# Patient Record
Sex: Female | Born: 1980 | Race: Black or African American | Hispanic: No | Marital: Married | Smoking: Never smoker
Health system: Southern US, Community
[De-identification: ages and names within clinical notes are randomized; demographics above are authoritative.]

## PROBLEM LIST (undated history)

## (undated) DIAGNOSIS — R51 Headache: Secondary | ICD-10-CM

## (undated) DIAGNOSIS — J45909 Unspecified asthma, uncomplicated: Secondary | ICD-10-CM

## (undated) DIAGNOSIS — T8484XA Pain due to internal orthopedic prosthetic devices, implants and grafts, initial encounter: Secondary | ICD-10-CM

## (undated) DIAGNOSIS — R519 Headache, unspecified: Secondary | ICD-10-CM

## (undated) HISTORY — PX: ORIF FOOT FRACTURE: SHX2123

## (undated) HISTORY — PX: FOOT SURGERY: SHX648

## (undated) HISTORY — PX: BREAST CYST EXCISION: SHX579

---

## 2013-08-24 ENCOUNTER — Ambulatory Visit (INDEPENDENT_AMBULATORY_CARE_PROVIDER_SITE_OTHER): Payer: Managed Care, Other (non HMO) | Admitting: Family Medicine

## 2013-08-24 ENCOUNTER — Ambulatory Visit: Payer: Managed Care, Other (non HMO)

## 2013-08-24 VITALS — BP 102/68 | HR 98 | Temp 98.3°F | Resp 16 | Ht 67.0 in | Wt 185.0 lb

## 2013-08-24 DIAGNOSIS — M79609 Pain in unspecified limb: Secondary | ICD-10-CM

## 2013-08-24 DIAGNOSIS — M79622 Pain in left upper arm: Secondary | ICD-10-CM

## 2013-08-24 DIAGNOSIS — M79671 Pain in right foot: Secondary | ICD-10-CM

## 2013-08-24 MED ORDER — PREDNISONE 20 MG PO TABS: 40.0000 mg | ORAL_TABLET | Freq: Every day | ORAL | Status: DC

## 2013-08-24 NOTE — Progress Notes (Addendum)
Subjective:    Patient ID: Kaitlyn Valentine, female    DOB: 1980/09/30, 33 y.o.   MRN: [id number]  HPI Scribed for Robyn Haber MD, the patient was seen in room 4. This chart was scribed by Denice Bors, ED scribe. Patient's care was started at 11:11 AM  HPI Comments: Hx was provided by the pt.  Kaitlyn Valentine is a 33 y.o. female who presents to the [facility] complaining of constant unchanged right foot pain onset chronic. Describes pain as "burning like pepper." Reports decreased ROM of all toes. Reports pain is exacerbated with movement and by touch. Denies any alleviating factors. Denies associated fever, numbness, and recent trauma/fall. Reports PMHx of right foot surgery last year in [place].    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   History reviewed. No pertinent past medical history.  Past Surgical History  Procedure Laterality Date   Fracture surgery     Cyst excision      Breast    No family history on file.  History   Social History   Marital Status: Single    Spouse Name: N/A    Number of Children: N/A   Years of Education: N/A   Occupational History   Not on file.   Social History Main Topics   Smoking status: Never Smoker    Smokeless tobacco: Not on file   Alcohol Use: Not on file   Drug Use: Not on file   Sexual Activity: Not on file   Other Topics Concern   Not on file   Social History Narrative   No narrative on file    No Known Allergies  There are no active problems to display for this patient.   Filed Vitals:   08/24/13 1106  BP: 102/68  Pulse: 98  Temp: 98.3 F (36.8 C)  Resp: 16  Height: 5\' 7"  (1.702 m)  Weight: 185 lb (83.915 kg)  SpO2: 100%       Review of Systems  Constitutional: Negative for fever.  Musculoskeletal:       Right foot pain   Psychiatric/Behavioral: Negative for confusion.       Objective:   Physical Exam  Nursing note and vitals  reviewed. Constitutional: She is oriented to person, place, and time. She appears well-developed and well-nourished. No distress.  HENT:  Head: Normocephalic and atraumatic.  Eyes: EOM are normal.  Neck: Neck supple. No tracheal deviation present.  Cardiovascular: Normal rate, intact distal pulses and normal pulses.   Pulses:      Dorsalis pedis pulses are 2+ on the right side.       Posterior tibial pulses are 2+ on the right side.  Pulmonary/Chest: Effort normal. No respiratory distress.  Musculoskeletal:       Right foot: She exhibits decreased range of motion.  Two parallel surgical scars on the dorsum of right    Neurological: She is alert and oriented to person, place, and time.  Skin: Skin is warm and dry.  Psychiatric: She has a normal mood and affect. Her behavior is normal.   [facility] reading (PRIMARY) by  Dr. Joseph Art:  Right foot. Screw and 2 plates are in position. There may be some backing out of the screw  Patient also complains about left axillary pain and she's had surgery for a lipoma there.     Assessment & Plan:     Foot pain, right - Plan: DG Foot Complete Right, Ambulatory referral to  Orthopedic Surgery  Left axillary pain - Plan: Ambulatory referral to Surgical Oncology  Signed, Robyn Haber, MD

## 2013-09-04 ENCOUNTER — Other Ambulatory Visit (INDEPENDENT_AMBULATORY_CARE_PROVIDER_SITE_OTHER): Payer: Self-pay | Admitting: General Surgery

## 2013-09-04 DIAGNOSIS — M79622 Pain in left upper arm: Secondary | ICD-10-CM

## 2013-09-05 ENCOUNTER — Telehealth (INDEPENDENT_AMBULATORY_CARE_PROVIDER_SITE_OTHER): Payer: Self-pay | Admitting: *Deleted

## 2013-09-05 NOTE — Telephone Encounter (Signed)
LMOM for pt to call back. Please advise pt of her apt for her [facility] [employer] is  at [place] [phone] arrive at 9:15 a.m. with appt time 9:30 a.m Address is [address] at [place] on the 4th floor.

## 2013-09-05 NOTE — Telephone Encounter (Signed)
Patient return call to our office, appt given for [place] on 09/14/13 @ 9:30, with arrival time of 9:15 at the [facility].  Reminder given to patient for appointment on 09/10/13 with Dr. Barry Dienes

## 2013-09-10 ENCOUNTER — Ambulatory Visit (INDEPENDENT_AMBULATORY_CARE_PROVIDER_SITE_OTHER): Payer: Managed Care, Other (non HMO) | Admitting: General Surgery

## 2013-09-10 ENCOUNTER — Telehealth (INDEPENDENT_AMBULATORY_CARE_PROVIDER_SITE_OTHER): Payer: Self-pay | Admitting: General Surgery

## 2013-09-10 ENCOUNTER — Encounter (INDEPENDENT_AMBULATORY_CARE_PROVIDER_SITE_OTHER): Payer: Self-pay | Admitting: General Surgery

## 2013-09-10 VITALS — BP 110/70 | HR 72 | Temp 97.2°F | Resp 16 | Ht 67.0 in | Wt 189.0 lb

## 2013-09-10 DIAGNOSIS — R229 Localized swelling, mass and lump, unspecified: Secondary | ICD-10-CM

## 2013-09-10 DIAGNOSIS — R2230 Localized swelling, mass and lump, unspecified upper limb: Secondary | ICD-10-CM

## 2013-09-10 DIAGNOSIS — M79609 Pain in unspecified limb: Secondary | ICD-10-CM

## 2013-09-10 DIAGNOSIS — M79622 Pain in left upper arm: Secondary | ICD-10-CM

## 2013-09-10 NOTE — Patient Instructions (Signed)
Main risks are bleeding, infection, pain, fluid/blood retention in cavity.  You will need to refrain from heavy/strenuous activity for 1-2 weeks after surgery.  I will see you back at 3-4 weeks post op.

## 2013-09-10 NOTE — Telephone Encounter (Signed)
Patient met with surgery scheduling went over financial responsibilities, patient will call back to schedule

## 2013-09-12 DIAGNOSIS — R2230 Localized swelling, mass and lump, unspecified upper limb: Secondary | ICD-10-CM | POA: Insufficient documentation

## 2013-09-12 NOTE — Assessment & Plan Note (Signed)
The patient appears to have a recurrent left axillary mass. 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 Given the fact that it's extremely tender and palpable, we will remove it.  I made need to remove some skin as well in order to make it lie flat.  I discussed that she could not do any heavy lifting or strenuous activity for 2-3 weeks.    I reviewed risks of bleeding, infection, damage to adjacent structures, chronic pain, swelling, etc.  The patient agrees to proceed.

## 2013-09-12 NOTE — Progress Notes (Signed)
Patient ID: Kaitlyn Valentine, female   DOB: 25-Nov-1980, 34 y.o.   MRN: [id number]  Chief Complaint  Patient presents with  . New Evaluation    Eval left axillary cyst    HPI Kaitlyn Valentine is a 33 y.o. female.   HPI  Pt is 33 yo female with a recurrent area of swelling in her L axilla.  She had this removed elsewhere multiple years ago.  It started enlarging again over the last few months.  The area is quite tender, and sticks out.  She is also having trouble keeping the area clean.  She denies fever/chills.    No past medical history on file.  Past Surgical History  Procedure Laterality Date  . Fracture surgery    . Cyst excision      Breast    No family history on file.  Social History History  Substance Use Topics  . Smoking status: Never Smoker   . Smokeless tobacco: Not on file  . Alcohol Use: Not on file    No Known Allergies  No current outpatient prescriptions on file.   No current facility-administered medications for this visit.    Review of Systems Review of Systems  All other systems reviewed and are negative.   Blood pressure 110/70, pulse 72, temperature 97.2 F (36.2 C), temperature source Temporal, resp. rate 16, height 5\' 7"  (1.702 m), weight 189 lb (85.73 kg), last menstrual period 08/17/2013.  Physical Exam Physical Exam  Constitutional: She is oriented to person, place, and time. She appears well-developed and well-nourished. No distress.  HENT:  Head: Normocephalic and atraumatic.  Eyes: Conjunctivae are normal. Pupils are equal, round, and reactive to light. No scleral icterus.  Neck: Normal range of motion. No thyromegaly present.  Cardiovascular: Normal rate and regular rhythm.   Pulmonary/Chest: Effort normal. No respiratory distress.    Tender mass in left axilla.  Around 4 cm x 2 cm.  Some chronic skin changes with irritation.    Abdominal: Soft. She exhibits no distension.  Musculoskeletal: Normal range of motion. She exhibits no  edema and no tenderness.  Neurological: She is alert and oriented to person, place, and time. Coordination normal.  Skin: Skin is warm and dry. No rash noted. She is not diaphoretic. No erythema. No pallor.  Psychiatric: She has a normal mood and affect. Her behavior is normal. Judgment and thought content normal.    Assessment/Plan    Left axillary pain Patient does not appear to have a recurrent lipoma, hidradenitis, or lymph node.  I think she is just feeling scar tissue.  Axillary mass, left The patient appears to have a recurrent left axillary mass. 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 Given the fact that it's extremely tender and palpable, we will remove it.  I made need to remove some skin as well in order to make it lie flat.  I discussed that she could not do any heavy lifting or strenuous activity for 2-3 weeks.    I reviewed risks of bleeding, infection, damage to adjacent structures, chronic pain, swelling, etc.  The patient agrees to proceed.     I advised her that I would not perform surgery until we got mammo/ultrasound.     Stark Klein 09/12/2013, 10:40 PM

## 2013-09-12 NOTE — Assessment & Plan Note (Signed)
Patient does not appear to have a recurrent lipoma, hidradenitis, or lymph node.  I think she is just feeling scar tissue.

## 2013-09-14 ENCOUNTER — Ambulatory Visit
Admission: RE | Admit: 2013-09-14 | Discharge: 2013-09-14 | Disposition: A | Discharge status: Home / Self Care | Payer: Managed Care, Other (non HMO) | Source: Ambulatory Visit | Attending: General Surgery | Admitting: General Surgery

## 2013-09-14 ENCOUNTER — Other Ambulatory Visit (INDEPENDENT_AMBULATORY_CARE_PROVIDER_SITE_OTHER): Payer: Self-pay | Admitting: General Surgery

## 2013-09-14 DIAGNOSIS — M79622 Pain in left upper arm: Secondary | ICD-10-CM

## 2013-11-07 ENCOUNTER — Encounter (HOSPITAL_BASED_OUTPATIENT_CLINIC_OR_DEPARTMENT_OTHER): Payer: Self-pay | Admitting: *Deleted

## 2013-11-14 ENCOUNTER — Telehealth (INDEPENDENT_AMBULATORY_CARE_PROVIDER_SITE_OTHER): Payer: Self-pay | Admitting: General Surgery

## 2013-11-14 ENCOUNTER — Encounter (HOSPITAL_BASED_OUTPATIENT_CLINIC_OR_DEPARTMENT_OTHER)
Admission: RE | Disposition: A | Discharge status: Home / Self Care | Payer: Self-pay | Source: Ambulatory Visit | Attending: General Surgery

## 2013-11-14 ENCOUNTER — Encounter (HOSPITAL_BASED_OUTPATIENT_CLINIC_OR_DEPARTMENT_OTHER): Payer: Self-pay | Admitting: *Deleted

## 2013-11-14 ENCOUNTER — Encounter (HOSPITAL_BASED_OUTPATIENT_CLINIC_OR_DEPARTMENT_OTHER): Payer: Managed Care, Other (non HMO) | Admitting: Anesthesiology

## 2013-11-14 ENCOUNTER — Ambulatory Visit (HOSPITAL_BASED_OUTPATIENT_CLINIC_OR_DEPARTMENT_OTHER)
Admission: RE | Admit: 2013-11-14 | Discharge: 2013-11-14 | Disposition: A | Discharge status: Home / Self Care | Payer: Managed Care, Other (non HMO) | Source: Ambulatory Visit | Attending: General Surgery | Admitting: General Surgery

## 2013-11-14 ENCOUNTER — Ambulatory Visit (HOSPITAL_BASED_OUTPATIENT_CLINIC_OR_DEPARTMENT_OTHER): Payer: Managed Care, Other (non HMO) | Admitting: Anesthesiology

## 2013-11-14 DIAGNOSIS — D1739 Benign lipomatous neoplasm of skin and subcutaneous tissue of other sites: Secondary | ICD-10-CM | POA: Insufficient documentation

## 2013-11-14 HISTORY — PX: MASS EXCISION: SHX2000

## 2013-11-14 LAB — POCT HEMOGLOBIN-HEMACUE: HEMOGLOBIN: 13.2 g/dL (ref 12.0–15.0)

## 2013-11-14 SURGERY — EXCISION MASS
Anesthesia: General | Site: Axilla | Laterality: Left

## 2013-11-14 MED ORDER — MIDAZOLAM HCL 2 MG/2ML IJ SOLN
INTRAMUSCULAR | Status: AC
  Filled 2013-11-14: qty 2

## 2013-11-14 MED ORDER — CEFAZOLIN SODIUM-DEXTROSE 2-3 GM-% IV SOLR
2.0000 g | INTRAVENOUS | Status: AC
  Administered 2013-11-14: 2 g via INTRAVENOUS

## 2013-11-14 MED ORDER — BUPIVACAINE-EPINEPHRINE (PF) 0.25% -1:200000 IJ SOLN
INTRAMUSCULAR | Status: DC | PRN
  Administered 2013-11-14: 20 mL via PERINEURAL

## 2013-11-14 MED ORDER — OXYCODONE HCL 5 MG PO TABS: 5.0000 mg | ORAL_TABLET | ORAL | Status: DC | PRN

## 2013-11-14 MED ORDER — BUPIVACAINE HCL (PF) 0.25 % IJ SOLN
INTRAMUSCULAR | Status: AC
  Filled 2013-11-14: qty 30

## 2013-11-14 MED ORDER — ACETAMINOPHEN 325 MG PO TABS: 650.0000 mg | ORAL_TABLET | ORAL | Status: DC | PRN

## 2013-11-14 MED ORDER — OXYCODONE HCL 5 MG PO TABS: 5.0000 mg | ORAL_TABLET | Freq: Once | ORAL | Status: DC | PRN

## 2013-11-14 MED ORDER — FENTANYL CITRATE 0.05 MG/ML IJ SOLN: 50.0000 ug | INTRAMUSCULAR | Status: DC | PRN

## 2013-11-14 MED ORDER — SODIUM CHLORIDE 0.9 % IV SOLN: 250.0000 mL | INTRAVENOUS | Status: DC | PRN

## 2013-11-14 MED ORDER — MIDAZOLAM HCL 5 MG/5ML IJ SOLN
INTRAMUSCULAR | Status: DC | PRN
  Administered 2013-11-14: 2 mg via INTRAVENOUS

## 2013-11-14 MED ORDER — HYDROMORPHONE HCL PF 1 MG/ML IJ SOLN
0.2500 mg | INTRAMUSCULAR | Status: DC | PRN
  Administered 2013-11-14 (×3): 0.5 mg via INTRAVENOUS

## 2013-11-14 MED ORDER — ONDANSETRON HCL 4 MG/2ML IJ SOLN
INTRAMUSCULAR | Status: DC | PRN
  Administered 2013-11-14: 4 mg via INTRAVENOUS

## 2013-11-14 MED ORDER — HYDROMORPHONE HCL PF 1 MG/ML IJ SOLN
INTRAMUSCULAR | Status: AC
  Filled 2013-11-14: qty 1

## 2013-11-14 MED ORDER — FENTANYL CITRATE 0.05 MG/ML IJ SOLN
INTRAMUSCULAR | Status: AC
  Filled 2013-11-14: qty 4

## 2013-11-14 MED ORDER — CEFAZOLIN SODIUM-DEXTROSE 2-3 GM-% IV SOLR
INTRAVENOUS | Status: AC
  Filled 2013-11-14: qty 50

## 2013-11-14 MED ORDER — LACTATED RINGERS IV SOLN
INTRAVENOUS | Status: DC
  Administered 2013-11-14 (×2): via INTRAVENOUS

## 2013-11-14 MED ORDER — SODIUM CHLORIDE 0.9 % IJ SOLN: 3.0000 mL | Freq: Two times a day (BID) | INTRAMUSCULAR | Status: DC

## 2013-11-14 MED ORDER — MIDAZOLAM HCL 2 MG/2ML IJ SOLN: 1.0000 mg | INTRAMUSCULAR | Status: DC | PRN

## 2013-11-14 MED ORDER — PROPOFOL 10 MG/ML IV BOLUS
INTRAVENOUS | Status: DC | PRN
  Administered 2013-11-14: 180 mg via INTRAVENOUS

## 2013-11-14 MED ORDER — LIDOCAINE HCL (CARDIAC) 20 MG/ML IV SOLN
INTRAVENOUS | Status: DC | PRN
  Administered 2013-11-14: 80 mg via INTRAVENOUS

## 2013-11-14 MED ORDER — SODIUM CHLORIDE 0.9 % IJ SOLN: 3.0000 mL | INTRAMUSCULAR | Status: DC | PRN

## 2013-11-14 MED ORDER — ACETAMINOPHEN 650 MG RE SUPP: 650.0000 mg | RECTAL | Status: DC | PRN

## 2013-11-14 MED ORDER — 0.9 % SODIUM CHLORIDE (POUR BTL) OPTIME
TOPICAL | Status: DC | PRN
  Administered 2013-11-14: 500 mL

## 2013-11-14 MED ORDER — FENTANYL CITRATE 0.05 MG/ML IJ SOLN
INTRAMUSCULAR | Status: DC | PRN
  Administered 2013-11-14: 50 ug via INTRAVENOUS
  Administered 2013-11-14 (×2): 25 ug via INTRAVENOUS
  Administered 2013-11-14: 50 ug via INTRAVENOUS
  Administered 2013-11-14: 25 ug via INTRAVENOUS

## 2013-11-14 MED ORDER — OXYCODONE HCL 5 MG/5ML PO SOLN: 5.0000 mg | Freq: Once | ORAL | Status: DC | PRN

## 2013-11-14 MED ORDER — ONDANSETRON HCL 4 MG/2ML IJ SOLN: 4.0000 mg | Freq: Once | INTRAMUSCULAR | Status: DC | PRN

## 2013-11-14 MED ORDER — DEXAMETHASONE SODIUM PHOSPHATE 4 MG/ML IJ SOLN
INTRAMUSCULAR | Status: DC | PRN
  Administered 2013-11-14: 10 mg via INTRAVENOUS

## 2013-11-14 MED ORDER — OXYCODONE-ACETAMINOPHEN 5-325 MG PO TABS
1.0000 | ORAL_TABLET | ORAL | Status: DC | PRN
Start: 2013-11-14 — End: 2013-12-06

## 2013-11-14 SURGICAL SUPPLY — 49 items
BLADE HEX COATED 2.75 (ELECTRODE) ×3 IMPLANT
BLADE SURG 10 STRL SS (BLADE) ×3 IMPLANT
BLADE SURG 15 STRL LF DISP TIS (BLADE) ×1 IMPLANT
BLADE SURG 15 STRL SS (BLADE) ×2
CANISTER SUCT 1200ML W/VALVE (MISCELLANEOUS) ×3 IMPLANT
CHLORAPREP W/TINT 26ML (MISCELLANEOUS) ×3 IMPLANT
CLOSURE WOUND 1/2 X4 (GAUZE/BANDAGES/DRESSINGS) ×1
COVER MAYO STAND STRL (DRAPES) ×3 IMPLANT
COVER TABLE BACK 60X90 (DRAPES) ×3 IMPLANT
DECANTER SPIKE VIAL GLASS SM (MISCELLANEOUS) IMPLANT
DERMABOND ADVANCED (GAUZE/BANDAGES/DRESSINGS) ×2
DERMABOND ADVANCED .7 DNX12 (GAUZE/BANDAGES/DRESSINGS) ×1 IMPLANT
DRAPE PED LAPAROTOMY (DRAPES) ×3 IMPLANT
DRAPE UTILITY XL STRL (DRAPES) ×3 IMPLANT
ELECT REM PT RETURN 9FT ADLT (ELECTROSURGICAL) ×3
ELECTRODE REM PT RTRN 9FT ADLT (ELECTROSURGICAL) ×1 IMPLANT
GLOVE BIO SURGEON STRL SZ 6 (GLOVE) ×3 IMPLANT
GLOVE BIOGEL PI IND STRL 6.5 (GLOVE) ×1 IMPLANT
GLOVE BIOGEL PI IND STRL 7.0 (GLOVE) ×2 IMPLANT
GLOVE BIOGEL PI INDICATOR 6.5 (GLOVE) ×2
GLOVE BIOGEL PI INDICATOR 7.0 (GLOVE) ×4
GLOVE ECLIPSE 6.5 STRL STRAW (GLOVE) ×3 IMPLANT
GOWN STRL REUS W/ TWL LRG LVL3 (GOWN DISPOSABLE) ×1 IMPLANT
GOWN STRL REUS W/TWL 2XL LVL3 (GOWN DISPOSABLE) ×3 IMPLANT
GOWN STRL REUS W/TWL LRG LVL3 (GOWN DISPOSABLE) ×2
NEEDLE HYPO 25X1 1.5 SAFETY (NEEDLE) ×3 IMPLANT
NS IRRIG 1000ML POUR BTL (IV SOLUTION) ×3 IMPLANT
PACK BASIN DAY SURGERY FS (CUSTOM PROCEDURE TRAY) ×3 IMPLANT
PACK UNIVERSAL I (CUSTOM PROCEDURE TRAY) ×3 IMPLANT
PENCIL BUTTON HOLSTER BLD 10FT (ELECTRODE) ×3 IMPLANT
SLEEVE SCD COMPRESS KNEE MED (MISCELLANEOUS) ×3 IMPLANT
SPONGE GAUZE 4X4 12PLY STER LF (GAUZE/BANDAGES/DRESSINGS) IMPLANT
SPONGE LAP 4X18 X RAY DECT (DISPOSABLE) ×3 IMPLANT
STAPLER VISISTAT 35W (STAPLE) IMPLANT
STRIP CLOSURE SKIN 1/2X4 (GAUZE/BANDAGES/DRESSINGS) ×2 IMPLANT
SUT MNCRL AB 4-0 PS2 18 (SUTURE) ×3 IMPLANT
SUT SILK 3 0 TIES 17X18 (SUTURE)
SUT SILK 3-0 18XBRD TIE BLK (SUTURE) IMPLANT
SUT VIC AB 2-0 SH 27 (SUTURE) ×4
SUT VIC AB 2-0 SH 27XBRD (SUTURE) ×2 IMPLANT
SUT VIC AB 3-0 SH 27 (SUTURE) ×4
SUT VIC AB 3-0 SH 27X BRD (SUTURE) ×2 IMPLANT
SUT VICRYL 4-0 PS2 18IN ABS (SUTURE) ×3 IMPLANT
SYR CONTROL 10ML LL (SYRINGE) ×3 IMPLANT
TOWEL OR 17X24 6PK STRL BLUE (TOWEL DISPOSABLE) ×3 IMPLANT
TOWEL OR NON WOVEN STRL DISP B (DISPOSABLE) ×3 IMPLANT
TUBE CONNECTING 20'X1/4 (TUBING) ×1
TUBE CONNECTING 20X1/4 (TUBING) ×2 IMPLANT
YANKAUER SUCT BULB TIP NO VENT (SUCTIONS) ×3 IMPLANT

## 2013-11-14 NOTE — H&P (Signed)
  Chief Complaint   Patient presents with   .  New Evaluation     Eval left axillary cyst   HPI  Kaitlyn Valentine is a 33 y.o. female.   HPI  Pt is 33 yo female with a recurrent area of swelling in her L axilla. She had this removed elsewhere multiple years ago. It started enlarging again over the last few months. The area is quite tender, and sticks out. She is also having trouble keeping the area clean. She denies fever/chills. No change since last exam.    No past medical history on file.  Past Surgical History   Procedure  Laterality  Date   .  Fracture surgery     .  Cyst excision       Breast   No family history on file.  Social History  History   Substance Use Topics   .  Smoking status:  Never Smoker   .  Smokeless tobacco:  Not on file   .  Alcohol Use:  Not on file   No Known Allergies  No current outpatient prescriptions on file.    No current facility-administered medications for this visit.   Review of Systems  Review of Systems  All other systems reviewed and are negative.   Wt Readings from Last 3 Encounters:  11/14/13 185 lb (83.915 kg)  11/14/13 185 lb (83.915 kg)  09/10/13 189 lb (85.73 kg)   Temp Readings from Last 3 Encounters:  11/14/13 98.1 F (36.7 C) Oral  11/14/13 98.1 F (36.7 C) Oral  09/10/13 97.2 F (36.2 C) Temporal   BP Readings from Last 3 Encounters:  11/14/13 97/70  11/14/13 97/70  09/10/13 110/70   Pulse Readings from Last 3 Encounters:  11/14/13 68  11/14/13 68  09/10/13 72    Physical Exam  Physical Exam  Constitutional: She is oriented to person, place, and time. She appears well-developed and well-nourished. No distress.  Pulmonary/Chest: Effort normal. No respiratory distress.    Tender mass in left axilla. Around 4 cm x 2 cm. Some chronic skin changes with irritation.  Abdominal: Soft. She exhibits no distension.  Skin: Skin is warm and dry. No rash noted. She is not diaphoretic. No erythema. No pallor.    Psychiatric: She has a normal mood and affect. Her behavior is normal. Judgment and thought content normal.   Assessment/Plan  Left axillary pain  Patient does not appear to have a recurrent lipoma, hidradenitis, or lymph node.  I think she is just feeling scar tissue.   Axillary mass, left  The patient appears to have a recurrent left axillary mass. 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 Given the fact that it's extremely tender and palpable, we will remove it. I made need to remove some skin as well in order to make it lie flat.  I discussed that she could not do any heavy lifting or strenuous activity for 2-3 weeks.  I reviewed risks of bleeding, infection, damage to adjacent structures, chronic pain, swelling, etc. The patient agrees to proceed.  Mammo/ultrasound OK.   Stark Klein

## 2013-11-14 NOTE — Discharge Instructions (Addendum)
[place] Office Phone Number [phone]   POST OP INSTRUCTIONS  Always review your discharge instruction sheet given to you by the facility where your surgery was performed.  IF YOU HAVE DISABILITY OR FAMILY LEAVE FORMS, YOU MUST BRING THEM TO THE OFFICE FOR PROCESSING.  DO NOT GIVE THEM TO YOUR DOCTOR.  1. A prescription for pain medication may be given to you upon discharge.  Take your pain medication as prescribed, if needed.  If narcotic pain medicine is not needed, then you may take acetaminophen (Tylenol) or ibuprofen (Advil) as needed. 2. Take your usually prescribed medications unless otherwise directed 3. If you need a refill on your pain medication, please contact your pharmacy.  They will contact our office to request authorization.  Prescriptions will not be filled after 5pm or on week-ends. 4. You should eat very light the first 24 hours after surgery, such as soup, crackers, pudding, etc.  Resume your normal diet the day after surgery 5. It is common to experience some constipation if taking pain medication after surgery.  Increasing fluid intake and taking a stool softener will usually help or prevent this problem from occurring.  A mild laxative (Milk of Magnesia or Miralax) should be taken according to package directions if there are no bowel movements after 48 hours. 6. You may shower in 48 hours.  The surgical glue will flake off in 2-3 weeks.   7. ACTIVITIES:  No strenuous activity or heavy lifting for 1 week.   a. You may drive when you no longer are taking prescription pain medication, you can comfortably wear a seatbelt, and you can safely maneuver your car and apply brakes. b. RETURN TO WORK:  _________2 weeks_____________ Dennis Bast should see your doctor in the office for a follow-up appointment approximately three-four weeks after your surgery.    WHEN TO CALL YOUR DOCTOR: 1. Fever over 101.0 2. Nausea and/or vomiting. 3. Extreme swelling or  bruising. 4. Continued bleeding from incision. 5. Increased pain, redness, or drainage from the incision.  The clinic staff is available to answer your questions during regular business hours.  Please dont hesitate to call and ask to speak to one of the nurses for clinical concerns.  If you have a medical emergency, go to the nearest emergency room or call 911.  A surgeon from [facility] is always on call at the hospital.  For further questions, please visit [email or web address]    Post Anesthesia Home Care Instructions  Activity: Get plenty of rest for the remainder of the day. A responsible adult should stay with you for 24 hours following the procedure.  For the next 24 hours, DO NOT: -Drive a car -Paediatric nurse -Drink alcoholic beverages -Take any medication unless instructed by your physician -Make any legal decisions or sign important papers.  Meals: Start with liquid foods such as gelatin or soup. Progress to regular foods as tolerated. Avoid greasy, spicy, heavy foods. If nausea and/or vomiting occur, drink only clear liquids until the nausea and/or vomiting subsides. Call your physician if vomiting continues.  Special Instructions/Symptoms: Your throat may feel dry or sore from the anesthesia or the breathing tube placed in your throat during surgery. If this causes discomfort, gargle with warm salt water. The discomfort should disappear within 24 hours.

## 2013-11-14 NOTE — Transfer of Care (Signed)
Immediate Anesthesia Transfer of Care Note  Patient: Kaitlyn Valentine  Procedure(s) Performed: Procedure(s): Excision left axillary mass  (Left)  Patient Location: PACU  Anesthesia Type:General  Level of Consciousness: awake, sedated and patient cooperative  Airway & Oxygen Therapy: Patient Spontanous Breathing and Patient connected to face mask oxygen  Post-op Assessment: Report given to PACU RN and Post -op Vital signs reviewed and stable  Post vital signs: Reviewed and stable  Complications: No apparent anesthesia complications

## 2013-11-14 NOTE — Op Note (Signed)
PRE-OPERATIVE DIAGNOSIS: symptomatic left axillary mass  POST-OPERATIVE DIAGNOSIS:  Same  PROCEDURE:  Procedure(s): Excision of left axillary mass, 10x4x3 cm, subcutaneous  SURGEON:  Surgeon(s): Stark Klein, MD  ANESTHESIA:   local and general  DRAINS: none   LOCAL MEDICATIONS USED:  MARCAINE     SPECIMEN:  Source of Specimen:  left axillary mass  DISPOSITION OF SPECIMEN:  PATHOLOGY  COUNTS:  YES  DICTATION: .Dragon Dictation  PLAN OF CARE: Discharge to home after PACU  PATIENT DISPOSITION:  PACU - hemodynamically stable.  EBL:  Minimal  FINDINGS:  Fatty left axillary mass  PROCEDURE:  Patient was identified in the holding area and taken to the operating room where she was placed supine on the operating room table. General anesthesia was induced. Her left axilla was prepped and draped in sterile fashion. Timeout was performed according to the surgical safety checklist. When all was correct, we continued. An ellipse was drawn over the top of the mass. This was infiltrated with local anesthetic. A #10 blade was used to score the skin and then the cautery was used to dissect down around the mass. This clinically appeared to be a large subcutaneous lipoma. The specimen was then passed off the table. Hemostasis was achieved with the cautery.  The area with her gaited and meticulously inspected for hemostasis. There is no additional side bleeding. The deep tissues were closed with interrupted 2-0 Vicryl. The deep dermal layer was reapproximated with interrupted 3-0 Vicryl. The skin was then run with 4-0 Monocryl subcuticular subcuticular suture. The wound was then cleaned, dried, and dressed with Dermabond and Steri-Strips. The patient was allowed to emerge from anesthesia and taken to the PACU in stable condition. Needle, sponge, and instrument counts were correct x2.

## 2013-11-14 NOTE — Anesthesia Preprocedure Evaluation (Signed)
Anesthesia Evaluation  Patient identified by MRN, date of birth, ID band Patient awake    Reviewed: Allergy & Precautions, H&P , NPO status , Patient's Chart, lab work & pertinent test results  Airway Mallampati: I TM Distance: >3 FB Neck ROM: Full    Dental  (+) Teeth Intact, Dental Advisory Given   Pulmonary  breath sounds clear to auscultation        Cardiovascular Rhythm:Regular Rate:Normal     Neuro/Psych    GI/Hepatic   Endo/Other    Renal/GU      Musculoskeletal   Abdominal   Peds  Hematology   Anesthesia Other Findings   Reproductive/Obstetrics                           Anesthesia Physical Anesthesia Plan  ASA: I  Anesthesia Plan: General   Post-op Pain Management:    Induction: Intravenous  Airway Management Planned: LMA  Additional Equipment:   Intra-op Plan:   Post-operative Plan: Extubation in OR  Informed Consent: I have reviewed the patients History and Physical, chart, labs and discussed the procedure including the risks, benefits and alternatives for the proposed anesthesia with the patient or authorized representative who has indicated his/her understanding and acceptance.   Dental advisory given  Plan Discussed with: CRNA, Anesthesiologist and Surgeon  Anesthesia Plan Comments:         Anesthesia Quick Evaluation

## 2013-11-14 NOTE — Telephone Encounter (Signed)
Left message for mom about surgery.

## 2013-11-14 NOTE — Anesthesia Postprocedure Evaluation (Signed)
  Anesthesia Post-op Note  Patient: Kaitlyn Valentine  Procedure(s) Performed: Procedure(s): Excision left axillary mass  (Left)  Patient Location: PACU  Anesthesia Type:General  Level of Consciousness: awake, alert  and oriented  Airway and Oxygen Therapy: Patient Spontanous Breathing  Post-op Pain: none  Post-op Assessment: Post-op Vital signs reviewed  Post-op Vital Signs: Reviewed  Last Vitals:  Filed Vitals:   11/14/13 1000  BP: 118/80  Pulse: 57  Temp:   Resp: 15    Complications: No apparent anesthesia complications

## 2013-11-14 NOTE — Anesthesia Procedure Notes (Signed)
Procedure Name: LMA Insertion Date/Time: 11/14/2013 8:42 AM Performed by: Lyndee Leo Pre-anesthesia Checklist: Patient identified, Emergency Drugs available, Suction available and Patient being monitored Patient Re-evaluated:Patient Re-evaluated prior to inductionOxygen Delivery Method: Circle System Utilized Preoxygenation: Pre-oxygenation with 100% oxygen Intubation Type: IV induction Ventilation: Mask ventilation without difficulty LMA: LMA inserted LMA Size: 4.0 Number of attempts: 1 Airway Equipment and Method: bite block Placement Confirmation: positive ETCO2 Tube secured with: Tape Dental Injury: Teeth and Oropharynx as per pre-operative assessment

## 2013-11-15 ENCOUNTER — Encounter (HOSPITAL_BASED_OUTPATIENT_CLINIC_OR_DEPARTMENT_OTHER): Payer: Self-pay | Admitting: General Surgery

## 2013-12-06 ENCOUNTER — Ambulatory Visit (INDEPENDENT_AMBULATORY_CARE_PROVIDER_SITE_OTHER): Payer: Managed Care, Other (non HMO) | Admitting: General Surgery

## 2013-12-06 ENCOUNTER — Encounter (INDEPENDENT_AMBULATORY_CARE_PROVIDER_SITE_OTHER): Payer: Self-pay | Admitting: General Surgery

## 2013-12-06 VITALS — BP 122/74 | HR 68 | Temp 98.9°F | Resp 14 | Ht 67.0 in | Wt 190.2 lb

## 2013-12-06 DIAGNOSIS — R229 Localized swelling, mass and lump, unspecified: Secondary | ICD-10-CM

## 2013-12-06 DIAGNOSIS — R2232 Localized swelling, mass and lump, left upper limb: Secondary | ICD-10-CM

## 2013-12-06 NOTE — Patient Instructions (Signed)
Follow up as needed  Ibuprofen for discomfort.

## 2013-12-06 NOTE — Assessment & Plan Note (Signed)
No evidence of surgical complications.    Follow up as needed.  Work on mobility of arm.

## 2013-12-06 NOTE — Progress Notes (Signed)
HISTORY: Pt is doing well post op.  She is still having a lot of soreness.  She denies fever/chills.      EXAM: General:  Alert and oriented.   Incision:  Small hematoma.     PATHOLOGY: Diagnosis Soft tissue, lipoma, left axillary mass - BENIGN BREAST PARENCHYMA, INCLUDING LOBULATED ADIPOSE TISSUE. - THERE IS NO EVIDENCE OF MALIGNANCY.   ASSESSMENT AND PLAN:   Axillary mass, left No evidence of surgical complications.    Follow up as needed.  Work on mobility of arm.        Milus Height, MD Surgical Oncology, [facility]  No PCP Per Patient No ref. provider found

## 2014-07-01 DIAGNOSIS — T8484XA Pain due to internal orthopedic prosthetic devices, implants and grafts, initial encounter: Secondary | ICD-10-CM

## 2014-07-01 HISTORY — DX: Pain due to internal orthopedic prosthetic devices, implants and grafts, initial encounter: T84.84XA

## 2014-07-15 ENCOUNTER — Encounter (HOSPITAL_BASED_OUTPATIENT_CLINIC_OR_DEPARTMENT_OTHER): Payer: Self-pay | Admitting: *Deleted

## 2014-07-18 ENCOUNTER — Ambulatory Visit (HOSPITAL_BASED_OUTPATIENT_CLINIC_OR_DEPARTMENT_OTHER): Payer: Managed Care, Other (non HMO) | Admitting: Anesthesiology

## 2014-07-18 ENCOUNTER — Encounter (HOSPITAL_BASED_OUTPATIENT_CLINIC_OR_DEPARTMENT_OTHER)
Admission: RE | Disposition: A | Discharge status: Home / Self Care | Payer: Self-pay | Source: Ambulatory Visit | Attending: Orthopedic Surgery

## 2014-07-18 ENCOUNTER — Ambulatory Visit (HOSPITAL_BASED_OUTPATIENT_CLINIC_OR_DEPARTMENT_OTHER)
Admission: RE | Admit: 2014-07-18 | Discharge: 2014-07-18 | Disposition: A | Discharge status: Home / Self Care | Payer: Managed Care, Other (non HMO) | Source: Ambulatory Visit | Attending: Orthopedic Surgery | Admitting: Orthopedic Surgery

## 2014-07-18 ENCOUNTER — Encounter (HOSPITAL_BASED_OUTPATIENT_CLINIC_OR_DEPARTMENT_OTHER): Payer: Self-pay | Admitting: Anesthesiology

## 2014-07-18 DIAGNOSIS — Y838 Other surgical procedures as the cause of abnormal reaction of the patient, or of later complication, without mention of misadventure at the time of the procedure: Secondary | ICD-10-CM | POA: Insufficient documentation

## 2014-07-18 DIAGNOSIS — T8484XA Pain due to internal orthopedic prosthetic devices, implants and grafts, initial encounter: Secondary | ICD-10-CM | POA: Insufficient documentation

## 2014-07-18 DIAGNOSIS — G43909 Migraine, unspecified, not intractable, without status migrainosus: Secondary | ICD-10-CM | POA: Insufficient documentation

## 2014-07-18 DIAGNOSIS — T8484XD Pain due to internal orthopedic prosthetic devices, implants and grafts, subsequent encounter: Secondary | ICD-10-CM

## 2014-07-18 HISTORY — DX: Pain due to internal orthopedic prosthetic devices, implants and grafts, initial encounter: T84.84XA

## 2014-07-18 HISTORY — PX: HARDWARE REMOVAL: SHX979

## 2014-07-18 HISTORY — DX: Headache: R51

## 2014-07-18 HISTORY — DX: Headache, unspecified: R51.9

## 2014-07-18 LAB — POCT HEMOGLOBIN-HEMACUE: Hemoglobin: 13.4 g/dL (ref 12.0–15.0)

## 2014-07-18 SURGERY — REMOVAL, HARDWARE
Anesthesia: General | Site: Foot | Laterality: Right

## 2014-07-18 MED ORDER — 0.9 % SODIUM CHLORIDE (POUR BTL) OPTIME
TOPICAL | Status: DC | PRN
  Administered 2014-07-18: 200 mL

## 2014-07-18 MED ORDER — ONDANSETRON HCL 4 MG/2ML IJ SOLN: 4.0000 mg | Freq: Once | INTRAMUSCULAR | Status: DC | PRN

## 2014-07-18 MED ORDER — FENTANYL CITRATE 0.05 MG/ML IJ SOLN: 50.0000 ug | INTRAMUSCULAR | Status: DC | PRN

## 2014-07-18 MED ORDER — MIDAZOLAM HCL 2 MG/2ML IJ SOLN
INTRAMUSCULAR | Status: AC
  Filled 2014-07-18: qty 2

## 2014-07-18 MED ORDER — FENTANYL CITRATE 0.05 MG/ML IJ SOLN
INTRAMUSCULAR | Status: DC | PRN
  Administered 2014-07-18 (×4): 50 ug via INTRAVENOUS

## 2014-07-18 MED ORDER — HYDROCODONE-ACETAMINOPHEN 5-325 MG PO TABS: 1.0000 | ORAL_TABLET | Freq: Four times a day (QID) | ORAL | Status: DC | PRN

## 2014-07-18 MED ORDER — FENTANYL CITRATE 0.05 MG/ML IJ SOLN
INTRAMUSCULAR | Status: AC
  Filled 2014-07-18: qty 6

## 2014-07-18 MED ORDER — MEPERIDINE HCL 25 MG/ML IJ SOLN: 6.2500 mg | INTRAMUSCULAR | Status: DC | PRN

## 2014-07-18 MED ORDER — LIDOCAINE HCL (CARDIAC) 20 MG/ML IV SOLN
INTRAVENOUS | Status: DC | PRN
  Administered 2014-07-18: 100 mg via INTRAVENOUS

## 2014-07-18 MED ORDER — MIDAZOLAM HCL 2 MG/2ML IJ SOLN: 1.0000 mg | INTRAMUSCULAR | Status: DC | PRN

## 2014-07-18 MED ORDER — CEFAZOLIN SODIUM-DEXTROSE 2-3 GM-% IV SOLR
INTRAVENOUS | Status: DC | PRN
  Administered 2014-07-18: 2 g via INTRAVENOUS

## 2014-07-18 MED ORDER — BUPIVACAINE-EPINEPHRINE 0.5% -1:200000 IJ SOLN
INTRAMUSCULAR | Status: DC | PRN
  Administered 2014-07-18: 16 mL

## 2014-07-18 MED ORDER — DEXAMETHASONE SODIUM PHOSPHATE 4 MG/ML IJ SOLN
INTRAMUSCULAR | Status: DC | PRN
  Administered 2014-07-18: 10 mg via INTRAVENOUS

## 2014-07-18 MED ORDER — MIDAZOLAM HCL 5 MG/5ML IJ SOLN
INTRAMUSCULAR | Status: DC | PRN
  Administered 2014-07-18: 2 mg via INTRAVENOUS

## 2014-07-18 MED ORDER — HYDROMORPHONE HCL 1 MG/ML IJ SOLN: 0.2500 mg | INTRAMUSCULAR | Status: DC | PRN

## 2014-07-18 MED ORDER — LACTATED RINGERS IV SOLN
INTRAVENOUS | Status: DC
  Administered 2014-07-18 (×2): via INTRAVENOUS

## 2014-07-18 MED ORDER — BUPIVACAINE-EPINEPHRINE (PF) 0.5% -1:200000 IJ SOLN
INTRAMUSCULAR | Status: AC
  Filled 2014-07-18: qty 30

## 2014-07-18 MED ORDER — PROPOFOL 10 MG/ML IV BOLUS
INTRAVENOUS | Status: DC | PRN
  Administered 2014-07-18: 150 mg via INTRAVENOUS

## 2014-07-18 MED ORDER — ONDANSETRON HCL 4 MG/2ML IJ SOLN
INTRAMUSCULAR | Status: DC | PRN
  Administered 2014-07-18: 4 mg via INTRAVENOUS

## 2014-07-18 MED ORDER — BACITRACIN ZINC 500 UNIT/GM EX OINT
TOPICAL_OINTMENT | CUTANEOUS | Status: DC | PRN
  Administered 2014-07-18: 1 via TOPICAL

## 2014-07-18 MED ORDER — BACITRACIN ZINC 500 UNIT/GM EX OINT
TOPICAL_OINTMENT | CUTANEOUS | Status: AC
  Filled 2014-07-18: qty 28.35

## 2014-07-18 MED ORDER — CEFAZOLIN SODIUM-DEXTROSE 2-3 GM-% IV SOLR
INTRAVENOUS | Status: AC
  Filled 2014-07-18: qty 50

## 2014-07-18 SURGICAL SUPPLY — 69 items
BAG DECANTER FOR FLEXI CONT (MISCELLANEOUS) IMPLANT
BANDAGE ELASTIC 4 VELCRO ST LF (GAUZE/BANDAGES/DRESSINGS) IMPLANT
BANDAGE ESMARK 6X9 LF (GAUZE/BANDAGES/DRESSINGS) ×1 IMPLANT
BLADE SURG 15 STRL LF DISP TIS (BLADE) ×2 IMPLANT
BLADE SURG 15 STRL SS (BLADE) ×4
BNDG COHESIVE 4X5 TAN STRL (GAUZE/BANDAGES/DRESSINGS) ×3 IMPLANT
BNDG COHESIVE 6X5 TAN STRL LF (GAUZE/BANDAGES/DRESSINGS) IMPLANT
BNDG ESMARK 4X9 LF (GAUZE/BANDAGES/DRESSINGS) IMPLANT
BNDG ESMARK 6X9 LF (GAUZE/BANDAGES/DRESSINGS) ×3
CHLORAPREP W/TINT 26ML (MISCELLANEOUS) ×3 IMPLANT
CLOSURE WOUND 1/2 X4 (GAUZE/BANDAGES/DRESSINGS)
CLOSURE WOUND 1/4X4 (GAUZE/BANDAGES/DRESSINGS) ×1
COVER BACK TABLE 60X90IN (DRAPES) ×3 IMPLANT
CUFF TOURNIQUET SINGLE 34IN LL (TOURNIQUET CUFF) IMPLANT
DECANTER SPIKE VIAL GLASS SM (MISCELLANEOUS) IMPLANT
DRAPE EXTREMITY T 121X128X90 (DRAPE) ×3 IMPLANT
DRAPE OEC MINIVIEW 54X84 (DRAPES) ×3 IMPLANT
DRAPE SURG 17X23 STRL (DRAPES) IMPLANT
DRAPE U-SHAPE 47X51 STRL (DRAPES) ×3 IMPLANT
DRSG EMULSION OIL 3X3 NADH (GAUZE/BANDAGES/DRESSINGS) ×3 IMPLANT
DRSG PAD ABDOMINAL 8X10 ST (GAUZE/BANDAGES/DRESSINGS) ×3 IMPLANT
DRSG TEGADERM 4X4.75 (GAUZE/BANDAGES/DRESSINGS) IMPLANT
ELECT REM PT RETURN 9FT ADLT (ELECTROSURGICAL) ×3
ELECTRODE REM PT RTRN 9FT ADLT (ELECTROSURGICAL) ×1 IMPLANT
GAUZE SPONGE 4X4 12PLY STRL (GAUZE/BANDAGES/DRESSINGS) ×3 IMPLANT
GLOVE BIO SURGEON STRL SZ8 (GLOVE) ×3 IMPLANT
GLOVE BIOGEL PI IND STRL 7.0 (GLOVE) ×1 IMPLANT
GLOVE BIOGEL PI IND STRL 8 (GLOVE) ×1 IMPLANT
GLOVE BIOGEL PI INDICATOR 7.0 (GLOVE) ×2
GLOVE BIOGEL PI INDICATOR 8 (GLOVE) ×2
GLOVE ECLIPSE 6.5 STRL STRAW (GLOVE) ×3 IMPLANT
GLOVE EXAM NITRILE MD LF STRL (GLOVE) ×3 IMPLANT
GOWN STRL REUS W/ TWL LRG LVL3 (GOWN DISPOSABLE) ×1 IMPLANT
GOWN STRL REUS W/ TWL XL LVL3 (GOWN DISPOSABLE) ×1 IMPLANT
GOWN STRL REUS W/TWL LRG LVL3 (GOWN DISPOSABLE) ×2
GOWN STRL REUS W/TWL XL LVL3 (GOWN DISPOSABLE) ×2
NEEDLE HYPO 22GX1.5 SAFETY (NEEDLE) ×3 IMPLANT
NEEDLE HYPO 25X1 1.5 SAFETY (NEEDLE) IMPLANT
PACK BASIN DAY SURGERY FS (CUSTOM PROCEDURE TRAY) ×3 IMPLANT
PAD CAST 4YDX4 CTTN HI CHSV (CAST SUPPLIES) ×1 IMPLANT
PADDING CAST ABS 4INX4YD NS (CAST SUPPLIES)
PADDING CAST ABS COTTON 4X4 ST (CAST SUPPLIES) IMPLANT
PADDING CAST COTTON 4X4 STRL (CAST SUPPLIES) ×2
PADDING CAST COTTON 6X4 STRL (CAST SUPPLIES) IMPLANT
PENCIL BUTTON HOLSTER BLD 10FT (ELECTRODE) ×3 IMPLANT
SANITIZER HAND PURELL 535ML FO (MISCELLANEOUS) ×3 IMPLANT
SHEET MEDIUM DRAPE 40X70 STRL (DRAPES) ×3 IMPLANT
SLEEVE SCD COMPRESS KNEE MED (MISCELLANEOUS) ×3 IMPLANT
SPLINT FAST PLASTER 5X30 (CAST SUPPLIES)
SPLINT PLASTER CAST FAST 5X30 (CAST SUPPLIES) IMPLANT
SPONGE LAP 18X18 X RAY DECT (DISPOSABLE) ×3 IMPLANT
STAPLER VISISTAT 35W (STAPLE) IMPLANT
STOCKINETTE 6  STRL (DRAPES) ×2
STOCKINETTE 6 STRL (DRAPES) ×1 IMPLANT
STRIP CLOSURE SKIN 1/2X4 (GAUZE/BANDAGES/DRESSINGS) IMPLANT
STRIP CLOSURE SKIN 1/4X4 (GAUZE/BANDAGES/DRESSINGS) ×2 IMPLANT
SUCTION FRAZIER TIP 10 FR DISP (SUCTIONS) IMPLANT
SUT ETHILON 3 0 PS 1 (SUTURE) IMPLANT
SUT MNCRL AB 3-0 PS2 18 (SUTURE) ×6 IMPLANT
SUT VIC AB 0 SH 27 (SUTURE) IMPLANT
SUT VIC AB 2-0 SH 27 (SUTURE)
SUT VIC AB 2-0 SH 27XBRD (SUTURE) IMPLANT
SUT VICRYL 4-0 PS2 18IN ABS (SUTURE) IMPLANT
SYR BULB 3OZ (MISCELLANEOUS) ×3 IMPLANT
SYR CONTROL 10ML LL (SYRINGE) ×3 IMPLANT
TOWEL OR 17X24 6PK STRL BLUE (TOWEL DISPOSABLE) ×6 IMPLANT
TUBE CONNECTING 20'X1/4 (TUBING)
TUBE CONNECTING 20X1/4 (TUBING) IMPLANT
UNDERPAD 30X30 INCONTINENT (UNDERPADS AND DIAPERS) ×3 IMPLANT

## 2014-07-18 NOTE — Anesthesia Preprocedure Evaluation (Signed)
Anesthesia Evaluation  Patient identified by MRN, date of birth, ID band Patient awake    Reviewed: Allergy & Precautions, NPO status , Patient's Chart, lab work & pertinent test results  Airway Mallampati: I  TM Distance: >3 FB Neck ROM: Full    Dental   Pulmonary          Cardiovascular     Neuro/Psych    GI/Hepatic   Endo/Other    Renal/GU      Musculoskeletal   Abdominal   Peds  Hematology   Anesthesia Other Findings   Reproductive/Obstetrics                             Anesthesia Physical Anesthesia Plan  ASA: I  Anesthesia Plan: General   Post-op Pain Management:    Induction: Intravenous  Airway Management Planned: LMA  Additional Equipment:   Intra-op Plan:   Post-operative Plan: Extubation in OR  Informed Consent: I have reviewed the patients History and Physical, chart, labs and discussed the procedure including the risks, benefits and alternatives for the proposed anesthesia with the patient or authorized representative who has indicated his/her understanding and acceptance.     Plan Discussed with: CRNA and Surgeon  Anesthesia Plan Comments:         Anesthesia Quick Evaluation

## 2014-07-18 NOTE — Anesthesia Postprocedure Evaluation (Signed)
Anesthesia Post Note  Patient: Kaitlyn Valentine  Procedure(s) Performed: Procedure(s) (LRB): REMOVAL OF DEEP IMPLANTS FROM DORSAL, MEDIAL AND LATERAL RIGHT FOOT (Right)  Anesthesia type: general  Patient location: PACU  Post pain: Pain level controlled  Post assessment: Patient's Cardiovascular Status Stable  Last Vitals:  Filed Vitals:   07/18/14 1545  BP: 109/69  Pulse: 69  Temp:   Resp: 15    Post vital signs: Reviewed and stable  Level of consciousness: sedated  Complications: No apparent anesthesia complications

## 2014-07-18 NOTE — Anesthesia Procedure Notes (Signed)
Procedure Name: LMA Insertion Date/Time: 07/18/2014 2:21 PM Performed by: Toula Moos L Pre-anesthesia Checklist: Patient identified, Emergency Drugs available, Suction available, Patient being monitored and Timeout performed Patient Re-evaluated:Patient Re-evaluated prior to inductionOxygen Delivery Method: Circle System Utilized Preoxygenation: Pre-oxygenation with 100% oxygen Intubation Type: IV induction Ventilation: Mask ventilation without difficulty LMA: LMA inserted LMA Size: 4.0 Number of attempts: 1 Airway Equipment and Method: Bite block Placement Confirmation: positive ETCO2 Tube secured with: Tape Dental Injury: Teeth and Oropharynx as per pre-operative assessment

## 2014-07-18 NOTE — Transfer of Care (Signed)
Immediate Anesthesia Transfer of Care Note  Patient: Kaitlyn Valentine  Procedure(s) Performed: Procedure(s): REMOVAL OF DEEP IMPLANTS FROM DORSAL, MEDIAL AND LATERAL RIGHT FOOT (Right)  Patient Location: PACU  Anesthesia Type:General  Level of Consciousness: awake and patient cooperative  Airway & Oxygen Therapy: Patient Spontanous Breathing and Patient connected to face mask oxygen  Post-op Assessment: Report given to RN and Post -op Vital signs reviewed and stable  Post vital signs: Reviewed and stable  Last Vitals:  Filed Vitals:   07/18/14 1125  BP: 138/72  Pulse: 71  Temp: 36.8 C  Resp: 16    Complications: No apparent anesthesia complications

## 2014-07-18 NOTE — H&P (Signed)
Kaitlyn Valentine is an 34 y.o. female.   Chief Complaint: right foot pain HPI: 34 y/o female with painful hardware in the right foot s/p ORIF of lisfranc fracture several years ago.  She presents now for removal of deep implants x 3.    Past Medical History  Diagnosis Date  . Headache     migraines/stress  . Painful orthopaedic hardware 07/2014    right foot    Past Surgical History  Procedure Laterality Date  . Orif foot fracture Right   . Breast cyst excision Left   . Mass excision Left 11/14/2013    Procedure: Excision left axillary mass ;  Surgeon: Stark Klein, MD;  Location: [facility];  Service: General;  Laterality: Left;    History reviewed. No pertinent family history. Social History:  reports that she has never smoked. She has never used smokeless tobacco. She reports that she does not drink alcohol or use illicit drugs.  Allergies: No Known Allergies  No prescriptions prior to admission    Results for orders placed or performed during the hospital encounter of Aug 02, 2014 (from the past 48 hour(s))  Hemoglobin-hemacue, POC     Status: None   Collection Time: August 02, 2014 11:54 AM  Result Value Ref Range   Hemoglobin 13.4 12.0 - 15.0 g/dL   No results found.  ROS  No recent f/c/n/v/wt loss  Blood pressure 138/72, pulse 71, temperature 98.2 F (36.8 C), temperature source Oral, resp. rate 16, height 5\' 7"  (1.702 m), weight 88.055 kg (194 lb 2 oz), last menstrual period 07/11/2014, SpO2 100 %. Physical Exam  wn wd woman in nad.  A and O x 4.  Mood and affect normal.  EOMI.  resp unlabored.  R foot with healed incisions dorsally, medially and laterally.  Skin healthy and intact.  Sens to LT intact.  5/5 strength in PF and DF of the ankle and toes.  Assessment/Plan Painful hardware right foot.  To OR for removal of deep implants x 3. The risks and benefits of the alternative treatment options have been discussed in detail.  The patient wishes to proceed with  surgery and specifically understands risks of bleeding, infection, nerve damage, blood clots, need for additional surgery, amputation and death.   Wylene Simmer 08/02/2014, 1:59 PM

## 2014-07-18 NOTE — Brief Op Note (Signed)
07/18/2014  3:16 PM  PATIENT:  Kaitlyn Valentine  34 y.o. female  PRE-OPERATIVE DIAGNOSIS:  painful hardware of the right foot dorsally, medially and laterally  POST-OPERATIVE DIAGNOSIS:  same  Procedure(s): 1.  REMOVAL OF DEEP IMPLANTS FROM DORSAL RIGHT FOOT 2.  Removal of deep implants from the medial right foot (separate incision) 3.  Removal of deep implants from the lateral right foot (separate incision) 4.  Ap and lateral xrays of the right foot  SURGEON:  Wylene Simmer, MD  ASSISTANT: n/a  ANESTHESIA:   General  EBL:  minimal   TOURNIQUET:   Total Tourniquet Time Documented: Leg (Right) - 33 minutes Total: Leg (Right) - 33 minutes   COMPLICATIONS:  None apparent  DISPOSITION:  Extubated, awake and stable to recovery.  DICTATION ID:  [id number]

## 2014-07-18 NOTE — Discharge Instructions (Signed)
Kaitlyn Simmer, MD [place]  Please read the following information regarding your care after surgery.  Medications  You only need a prescription for the narcotic pain medicine (ex. oxycodone, Percocet, Norco).  All of the other medicines listed below are available over the counter. X norco as presecribed for severe pain X ibuprofen (Motrin, Advil) 800 mg every 8 hours as needed for mild or moderate pain  Narcotic pain medicine (ex. oxycodone, Percocet, Vicodin) will cause constipation.  To prevent this problem, take the following medicines while you are taking any pain medicine. X docusate sodium (Colace) 100 mg twice a day X senna (Senokot) 2 tablets twice a day  Weight Bearing ? Bear weight when you are able on your operated leg or foot. X Bear weight only on your operated foot in the post-op shoe. ? Do not bear any weight on the operated leg or foot.  Cast / Splint / Dressing ? Keep your splint or cast clean and dry.  Dont put anything (coat hanger, pencil, etc) down inside of it.  If it gets damp, use a hair dryer on the cool setting to dry it.  If it gets soaked, call the office to schedule an appointment for a cast change. X Remove your dressing 3 days after surgery and cover the incisions with dry dressings (4x4 gauze and Ace bandage)  After your dressing, cast or splint is removed; you may shower, but do not soak or scrub the wound.  Allow the water to run over it, and then gently pat it dry.  Swelling It is normal for you to have swelling where you had surgery.  To reduce swelling and pain, keep your toes above your nose for at least 3 days after surgery.  It may be necessary to keep your foot or leg elevated for several weeks.  If it hurts, it should be elevated.  Follow Up Call my office at [phone] when you are discharged from the hospital or surgery center to schedule an appointment to be seen two weeks after surgery.  Call my office at [phone] if you  develop a fever >101.5 F, nausea, vomiting, bleeding from the surgical site or severe pain.     Post Anesthesia Home Care Instructions  Activity: Get plenty of rest for the remainder of the day. A responsible adult should stay with you for 24 hours following the procedure.  For the next 24 hours, DO NOT: -Drive a car -Paediatric nurse -Drink alcoholic beverages -Take any medication unless instructed by your physician -Make any legal decisions or sign important papers.  Meals: Start with liquid foods such as gelatin or soup. Progress to regular foods as tolerated. Avoid greasy, spicy, heavy foods. If nausea and/or vomiting occur, drink only clear liquids until the nausea and/or vomiting subsides. Call your physician if vomiting continues.  Special Instructions/Symptoms: Your throat may feel dry or sore from the anesthesia or the breathing tube placed in your throat during surgery. If this causes discomfort, gargle with warm salt water. The discomfort should disappear within 24 hours.

## 2014-07-19 ENCOUNTER — Encounter (HOSPITAL_BASED_OUTPATIENT_CLINIC_OR_DEPARTMENT_OTHER): Payer: Self-pay | Admitting: Orthopedic Surgery

## 2014-07-19 NOTE — Op Note (Signed)
NAME:  Kaitlyn Valentine, Kaitlyn Valentine NO.:  [id number]  MEDICAL RECORD NO.:  [id number]  LOCATION:                                 FACILITY:  PHYSICIAN:  Wylene Simmer, MD             DATE OF BIRTH:  DATE OF PROCEDURE:  07/18/2014 DATE OF DISCHARGE:                              OPERATIVE REPORT   PREOPERATIVE DIAGNOSIS:  Painful hardware at the right dorsal, medial, and lateral foot status post open reduction and internal fixation of right Lisfranc fracture dislocation.  POSTOPERATIVE DIAGNOSIS:  Painful hardware at the right dorsal, medial, and lateral foot status post open reduction and internal fixation of right Lisfranc fracture dislocation.  PROCEDURE: 1. Removal of deep implants from the right dorsal foot incision. 2. Removal of deep implants from the medial right foot through a     separate incision. 3. Removal of deep implants from the lateral right foot through a     separate incision. 4. AP and lateral radiographs of the right foot.  SURGEON:  Wylene Simmer, MD  ANESTHESIA:  General.  ESTIMATED BLOOD LOSS:  Minimal.  TOURNIQUET TIME:  33 minutes with an ankle Esmarch.  COMPLICATIONS:  None apparent.  DISPOSITION:  Extubated awake and stable to recovery.  INDICATIONS FOR PROCEDURE:  The patient is a 34 year old woman who underwent ORIF of a Lisfranc fracture dislocation of her right foot several years ago at an outside hospital.  She presented with complaints of foot pain and desires removal of the hardware in an effort to improve her condition.  She understands the risks and benefits, the alternative treatment options, and elects surgical treatment.  She specifically understands risks of bleeding, infection, nerve damage, blood clots, need for additional surgery, continued pain, amputation, and death.  PROCEDURE IN DETAIL:  After preoperative consent was obtained and the correct operative site was identified, the patient was brought to the operating room  and placed supine on the operating table.  General anesthesia was induced.  Preoperative antibiotics were administered. Surgical time-out was taken.  The right lower extremity was prepped and draped in standard sterile fashion.  Foot was exsanguinated and a 6-inch Esmarch tourniquet was wrapped around the ankle.  The patient's lateral foot incision was identified.  The incision was made again with a scalpel and sharp dissection was carried down through the skin and subcutaneous tissue.  Blunt dissection was carried down to the superficial level of the plate.  The plate was cleared of all soft tissues.  The distal 2 screws were identified at the fourth and fifth metatarsals, and these were removed in their entirety.  The 2 proximal screws were identified of the cuboid and these were also removed in their entirety.  The plate was then freed from the surrounding soft tissues and removed in its entirety.  The tissue surrounding the plate was removed with a rongeur.  The wound was irrigated copiously.  Attention was then turned to the dorsal incision where a longitudinal incision was again made.  Sharp dissection was carried down through the skin and subcutaneous tissue.  The extensor digitorum brevis tendon and muscle belly were identified.  They were retracted out of  the way exposing the dorsal aspect of the plate.  The distal screws were removed from the second and third metatarsals.  Proximal screws were removed from the middle and lateral cuneiforms.  The plate was freed from its overlying soft tissues and was removed in its entirety.  Again the soft tissue around the plate was removed with a rongeur and the wound was irrigated copiously.  Attention was then turned to the medial incision where it was opened sharply with a scalpel.  The guide pin for a Synthes cannulated screw was then inserted into the screw.  A cannulated screwdriver was used to remove the screw in its entirety.  AP  and lateral radiographs of the foot were obtained showing complete removal of all hardware.  The wound was then irrigated and closed with subcuticular 3-0 Monocryl suture.  The dorsal and lateral incisions were closed with inverted simple sutures of 3-0 Monocryl followed by running 3-0 subcuticular Monocryl.  Steri-Strips were applied to all of the incisions.  Sterile dressings were applied followed by compression wrap. Tourniquet was released at 33 minutes after application of the dressings.  0.5% Marcaine with epinephrine was infiltrated into the subcutaneous tissue around all 3 incisions prior to closure.  The patient was then awakened from anesthesia and transported to the recovery room in stable condition.  FOLLOWUP PLAN:  The patient will be weightbearing as tolerated on her right foot in a flat postop shoe.  She can remove her dressings in 3 days and cover with dry dressings and Ace wrap.  She will follow up with me in 2 weeks for wound check.  RADIOGRAPHS:  AP and lateral radiographs were obtained intraoperatively of the right foot today.  These show interval removal of all metallic hardware from across the TMT joints.  No other acute injuries were noted.     Wylene Simmer, MD     JH/MEDQ  D:  07/18/2014  T:  07/19/2014  Job:  [id number]

## 2014-10-29 ENCOUNTER — Inpatient Hospital Stay (HOSPITAL_COMMUNITY)
Admission: AD | Admit: 2014-10-29 | Discharge: 2014-10-29 | Disposition: A | Discharge status: Home / Self Care | Payer: Managed Care, Other (non HMO) | Source: Ambulatory Visit | Attending: Family Medicine | Admitting: Family Medicine

## 2014-10-29 ENCOUNTER — Encounter (HOSPITAL_COMMUNITY): Payer: Self-pay | Admitting: *Deleted

## 2014-10-29 DIAGNOSIS — L299 Pruritus, unspecified: Secondary | ICD-10-CM | POA: Diagnosis present

## 2014-10-29 DIAGNOSIS — N760 Acute vaginitis: Secondary | ICD-10-CM

## 2014-10-29 DIAGNOSIS — B9689 Other specified bacterial agents as the cause of diseases classified elsewhere: Secondary | ICD-10-CM | POA: Diagnosis not present

## 2014-10-29 DIAGNOSIS — A499 Bacterial infection, unspecified: Secondary | ICD-10-CM | POA: Diagnosis not present

## 2014-10-29 LAB — URINALYSIS, ROUTINE W REFLEX MICROSCOPIC
Bilirubin Urine: NEGATIVE
Glucose, UA: NEGATIVE mg/dL
Ketones, ur: NEGATIVE mg/dL
Leukocytes, UA: NEGATIVE
Nitrite: NEGATIVE
PROTEIN: NEGATIVE mg/dL
Specific Gravity, Urine: >1.03 — ABNORMAL HIGH (ref 1.005–1.030)
UROBILINOGEN UA: 0.2 mg/dL (ref 0.0–1.0)
pH: 5.5 (ref 5.0–8.0)

## 2014-10-29 LAB — URINE MICROSCOPIC-ADD ON

## 2014-10-29 LAB — WET PREP, GENITAL
Trich, Wet Prep: NONE SEEN
WBC, Wet Prep HPF POC: NONE SEEN
Yeast Wet Prep HPF POC: NONE SEEN

## 2014-10-29 LAB — POCT PREGNANCY, URINE: PREG TEST UR: NEGATIVE

## 2014-10-29 MED ORDER — METRONIDAZOLE 500 MG PO TABS: 500.0000 mg | ORAL_TABLET | Freq: Two times a day (BID) | ORAL | Status: DC

## 2014-10-29 MED ORDER — TERCONAZOLE 0.4 % VA CREA
1.0000 | TOPICAL_CREAM | Freq: Every day | VAGINAL | Status: DC
Start: 2014-10-29 — End: 2016-04-06

## 2014-10-29 NOTE — Discharge Instructions (Signed)
Bacterial Vaginosis Bacterial vaginosis is a vaginal infection that occurs when the normal balance of bacteria in the vagina is disrupted. It results from an overgrowth of certain bacteria. This is the most common vaginal infection in women of childbearing age. Treatment is important to prevent complications, especially in pregnant women, as it can cause a premature delivery. CAUSES  Bacterial vaginosis is caused by an increase in harmful bacteria that are normally present in smaller amounts in the vagina. Several different kinds of bacteria can cause bacterial vaginosis. However, the reason that the condition develops is not fully understood. RISK FACTORS Certain activities or behaviors can put you at an increased risk of developing bacterial vaginosis, including:  Having a new sex partner or multiple sex partners.  Douching.  Using an intrauterine device (IUD) for contraception. Women do not get bacterial vaginosis from toilet seats, bedding, swimming pools, or contact with objects around them. SIGNS AND SYMPTOMS  Some women with bacterial vaginosis have no signs or symptoms. Common symptoms include:  Grey vaginal discharge.  A fishlike odor with discharge, especially after sexual intercourse.  Itching or burning of the vagina and vulva.  Burning or pain with urination. DIAGNOSIS  Your health care provider will take a medical history and examine the vagina for signs of bacterial vaginosis. A sample of vaginal fluid may be taken. Your health care provider will look at this sample under a microscope to check for bacteria and abnormal cells. A vaginal pH test may also be done.  TREATMENT  Bacterial vaginosis may be treated with antibiotic medicines. These may be given in the form of a pill or a vaginal cream. A second round of antibiotics may be prescribed if the condition comes back after treatment.  HOME CARE INSTRUCTIONS   Only take over-the-counter or prescription medicines as  directed by your health care provider.  If antibiotic medicine was prescribed, take it as directed. Make sure you finish it even if you start to feel better.  Do not have sex until treatment is completed.  Tell all sexual partners that you have a vaginal infection. They should see their health care provider and be treated if they have problems, such as a mild rash or itching.  Practice safe sex by using condoms and only having one sex partner. SEEK MEDICAL CARE IF:   Your symptoms are not improving after 3 days of treatment.  You have increased discharge or pain.  You have a fever. MAKE SURE YOU:   Understand these instructions.  Will watch your condition.  Will get help right away if you are not doing well or get worse. FOR MORE INFORMATION  Centers for Disease Control and Prevention, Division of STD Prevention: www.cdc.gov/std American Sexual Health Association (ASHA): www.ashastd.org  Document Released: 05/17/2005 Document Revised: 03/07/2013 Document Reviewed: 12/27/2012 ExitCare Patient Information 2015 [employer]. This information is not intended to replace advice given to you by your health care provider. Make sure you discuss any questions you have with your health care provider.  

## 2014-10-29 NOTE — MAU Note (Signed)
Vag itching for past wk. Changed detergent, unsure if that caused it

## 2014-10-29 NOTE — MAU Provider Note (Signed)
History     CSN: [id number]  Arrival date and time: 10/29/14 1019   First Provider Initiated Contact with Patient 10/29/14 1129      Chief Complaint  Patient presents with  . Vaginal Itching   HPI  Kaitlyn Valentine is a [id number] here with report of vag itching for past week.  Denies any abnormal vaginal discharge.  Changed detergent and itching decreased.  Desires to check for yeast infection.    Past Medical History  Diagnosis Date  . Headache     Past Surgical History  Procedure Laterality Date  . Foot surgery Right     Had screws and plate after fx, recently removed plate and screws  . Breast cyst excision      History reviewed. No pertinent family history.  History  Substance Use Topics  . Smoking status: Never Smoker   . Smokeless tobacco: Never Used  . Alcohol Use: No    Allergies: No Known Allergies  No prescriptions prior to admission    Review of Systems  Genitourinary:       Vaginal itching  All other systems reviewed and are negative.  Physical Exam   Blood pressure 108/74, pulse 76, temperature 98.7 F (37.1 C), temperature source Oral, resp. rate 18, height 5\' 7"  (1.702 m), weight 88.905 kg (196 lb), last menstrual period 10/01/2014.  Physical Exam  Constitutional: She is oriented to person, place, and time. She appears well-developed and well-nourished. No distress.  HENT:  Head: Normocephalic.  Eyes: Pupils are equal, round, and reactive to light.  Neck: Normal range of motion. Neck supple.  Cardiovascular: Normal rate, regular rhythm and normal heart sounds.   Respiratory: Effort normal and breath sounds normal.  GI: Soft. There is no tenderness.  Genitourinary: There is erythema in the vagina. No bleeding in the vagina. Vaginal discharge ( white thin) found.  Negative cervical motion tenderness  Neurological: She is alert and oriented to person, place, and time. She has normal reflexes.  Skin: Skin is warm and dry.    MAU  Course  Procedures  Results for orders placed or performed during the hospital encounter of 10/29/14 (from the past 24 hour(s))  Urinalysis, Routine w reflex microscopic (not at [facility])     Status: Abnormal   Collection Time: 10/29/14 10:39 AM  Result Value Ref Range   Color, Urine YELLOW YELLOW   APPearance CLEAR CLEAR   Specific Gravity, Urine >1.030 (H) 1.005 - 1.030   pH 5.5 5.0 - 8.0   Glucose, UA NEGATIVE NEGATIVE mg/dL   Hgb urine dipstick TRACE (A) NEGATIVE   Bilirubin Urine NEGATIVE NEGATIVE   Ketones, ur NEGATIVE NEGATIVE mg/dL   Protein, ur NEGATIVE NEGATIVE mg/dL   Urobilinogen, UA 0.2 0.0 - 1.0 mg/dL   Nitrite NEGATIVE NEGATIVE   Leukocytes, UA NEGATIVE NEGATIVE  Urine microscopic-add on     Status: Abnormal   Collection Time: 10/29/14 10:39 AM  Result Value Ref Range   Squamous Epithelial / LPF FEW (A) RARE   WBC, UA 0-2 <3 WBC/hpf   Urine-Other MUCOUS PRESENT   Pregnancy, urine POC     Status: None   Collection Time: 10/29/14 11:21 AM  Result Value Ref Range   Preg Test, Ur NEGATIVE NEGATIVE  Wet prep, genital     Status: Abnormal   Collection Time: 10/29/14 11:39 AM  Result Value Ref Range   Yeast Wet Prep HPF POC NONE SEEN NONE SEEN   Trich, Wet Prep NONE SEEN NONE SEEN  Clue Cells Wet Prep HPF POC FEW (A) NONE SEEN   WBC, Wet Prep HPF POC NONE SEEN NONE SEEN     Assessment and Plan  Bacterial Vaginosis  Plan: Discharge to home RX Flagyl 500 mg BID x 7 days RX Terazol Follow-up for worsening or no improvement in symptoms Walidah N Karim, CNM   KARIM, [facility] 10/29/2014, 11:30 AM

## 2015-09-08 ENCOUNTER — Emergency Department (HOSPITAL_COMMUNITY)
Admission: EM | Admit: 2015-09-08 | Discharge: 2015-09-09 | Disposition: A | Discharge status: Home / Self Care | Payer: Managed Care, Other (non HMO) | Attending: Emergency Medicine | Admitting: Emergency Medicine

## 2015-09-08 ENCOUNTER — Emergency Department (HOSPITAL_COMMUNITY): Payer: Managed Care, Other (non HMO)

## 2015-09-08 ENCOUNTER — Encounter (HOSPITAL_COMMUNITY): Payer: Self-pay | Admitting: *Deleted

## 2015-09-08 DIAGNOSIS — R05 Cough: Secondary | ICD-10-CM | POA: Diagnosis present

## 2015-09-08 DIAGNOSIS — J069 Acute upper respiratory infection, unspecified: Secondary | ICD-10-CM | POA: Diagnosis not present

## 2015-09-08 DIAGNOSIS — R079 Chest pain, unspecified: Secondary | ICD-10-CM | POA: Insufficient documentation

## 2015-09-08 DIAGNOSIS — Z792 Long term (current) use of antibiotics: Secondary | ICD-10-CM | POA: Diagnosis not present

## 2015-09-08 MED ORDER — IPRATROPIUM-ALBUTEROL 0.5-2.5 (3) MG/3ML IN SOLN
3.0000 mL | Freq: Once | RESPIRATORY_TRACT | Status: AC
  Administered 2015-09-08: 3 mL via RESPIRATORY_TRACT

## 2015-09-08 MED ORDER — IPRATROPIUM-ALBUTEROL 0.5-2.5 (3) MG/3ML IN SOLN
RESPIRATORY_TRACT | Status: AC
  Filled 2015-09-08: qty 3

## 2015-09-08 MED ORDER — BENZONATATE 100 MG PO CAPS: 100.0000 mg | ORAL_CAPSULE | Freq: Three times a day (TID) | ORAL | Status: DC

## 2015-09-08 MED ORDER — IBUPROFEN 800 MG PO TABS: 800.0000 mg | ORAL_TABLET | Freq: Three times a day (TID) | ORAL | Status: DC

## 2015-09-08 NOTE — Discharge Instructions (Signed)

## 2015-09-08 NOTE — ED Provider Notes (Signed)
CSN: [id number]     Arrival date & time 09/08/15  2124 History  By signing my name below, I, Julien Nordmann, attest that this documentation has been prepared under the direction and in the presence of Fransico Meadow, PA-C. Electronically Signed: Julien Nordmann, ED Scribe. 09/08/2015. 11:48 PM.    Chief Complaint  Patient presents with  . Cough      The history is provided by the patient. No language interpreter was used.   HPI Comments: Kaitlyn Valentine is a 35 y.o. female who presents to the Emergency Department complaining of sudden onset, gradual worsening, moderate generalized chest pain secondary to a dry cough onset 4 days ago. Pt says there are no other symptoms. She notes taking theraflu to alleviate her symptoms without any relief. Pt has not taken any pain medication to alleviate her pain. Denies fever or chills.   Past Medical History  Diagnosis Date  . Headache    Past Surgical History  Procedure Laterality Date  . Foot surgery Right     Had screws and plate after fx, recently removed plate and screws  . Breast cyst excision     History reviewed. No pertinent family history. Social History  Substance Use Topics  . Smoking status: Never Smoker   . Smokeless tobacco: Never Used  . Alcohol Use: No   OB History    Gravida Para Term Preterm AB TAB SAB Ectopic Multiple Living   3 3 3       3      Review of Systems  Respiratory: Positive for cough.   Cardiovascular: Positive for chest pain.  All other systems reviewed and are negative.     Allergies  Review of patient's allergies indicates no known allergies.  Home Medications   Prior to Admission medications   Medication Sig Start Date End Date Taking? Authorizing Provider  metroNIDAZOLE (FLAGYL) 500 MG tablet Take 1 tablet (500 mg total) by mouth 2 (two) times daily. 10/29/14   Gwen Pounds, CNM  terconazole (TERAZOL 7) 0.4 % vaginal cream Place 1 applicator vaginally at bedtime. 10/29/14   Gwen Pounds, CNM   Triage vitals: BP 111/79 mmHg  Pulse 75  Temp(Src) 98.2 F (36.8 C) (Oral)  Resp 22  Ht 5\' 7"  (1.702 m)  Wt 198 lb 8 oz (90.039 kg)  BMI 31.08 kg/m2  SpO2 100%  LMP 08/29/2015 (Approximate) Physical Exam  Constitutional: She is oriented to person, place, and time. She appears well-developed and well-nourished.  HENT:  Head: Normocephalic.  Eyes: EOM are normal.  Neck: Normal range of motion.  Pulmonary/Chest: Effort normal.  Chest soreness  Abdominal: She exhibits no distension.  Musculoskeletal: Normal range of motion.  Neurological: She is alert and oriented to person, place, and time.  Psychiatric: She has a normal mood and affect.  Nursing note and vitals reviewed.   ED Course  Procedures  DIAGNOSTIC STUDIES: Oxygen Saturation is 100% on RA, normal by my interpretation.  COORDINATION OF CARE:  11:46 PM Discussed treatment plan with pt at bedside and pt agreed to plan.  Labs Review Labs Reviewed - No data to display  Imaging Review Dg Chest 2 View  09/08/2015  CLINICAL DATA:  Cough and congestion for 3 days. EXAM: CHEST  2 VIEW COMPARISON:  None. FINDINGS: The heart size and mediastinal contours are within normal limits. Both lungs are clear. The visualized skeletal structures are unremarkable. IMPRESSION: No active cardiopulmonary disease. Electronically Signed   By: Andreas Newport  M.D.   On: 09/08/2015 21:57   I have personally reviewed and evaluated these images and lab results as part of my medical decision-making.   EKG Interpretation None      MDM   Final diagnoses:  URI, acute    Meds ordered this encounter  Medications  . ipratropium-albuterol (DUONEB) 0.5-2.5 (3) MG/3ML nebulizer solution 3 mL    Sig:   . ipratropium-albuterol (DUONEB) 0.5-2.5 (3) MG/3ML nebulizer solution    Sig:     Doug Sou, Eric   : cabinet override  . benzonatate (TESSALON) 100 MG capsule    Sig: Take 1 capsule (100 mg total) by mouth every 8 (eight) hours.     Dispense:  21 capsule    Refill:  0    Order Specific Question:  Supervising Provider    Answer:  MILLER, BRIAN [[id number]]  . ibuprofen (ADVIL,MOTRIN) 800 MG tablet    Sig: Take 1 tablet (800 mg total) by mouth 3 (three) times daily.    Dispense:  21 tablet    Refill:  0    Order Specific Question:  Supervising Provider    Answer:  Noemi Chapel [[id number]]  An After Visit Summary was printed and given to the patient.  Hollace Kinnier [place], PA-C 09/09/15 0003  Leo Grosser, MD 09/09/15 [phone]

## 2015-09-08 NOTE — ED Notes (Signed)
Pt c/o cough with chest soreness since Friday. Denies any other symptoms. Pt states she took Theraflu without relief. Pt has audible wheezing in triage.

## 2015-09-09 NOTE — ED Notes (Signed)
Patient able to ambulate independently  

## 2015-09-10 ENCOUNTER — Encounter (HOSPITAL_COMMUNITY): Payer: Self-pay | Admitting: Emergency Medicine

## 2015-09-10 ENCOUNTER — Ambulatory Visit (HOSPITAL_COMMUNITY)
Admission: EM | Admit: 2015-09-10 | Discharge: 2015-09-10 | Disposition: A | Discharge status: Home / Self Care | Payer: Managed Care, Other (non HMO) | Attending: Family Medicine | Admitting: Family Medicine

## 2015-09-10 DIAGNOSIS — J45901 Unspecified asthma with (acute) exacerbation: Secondary | ICD-10-CM

## 2015-09-10 MED ORDER — ALBUTEROL SULFATE (2.5 MG/3ML) 0.083% IN NEBU
2.5000 mg | INHALATION_SOLUTION | Freq: Once | RESPIRATORY_TRACT | Status: AC
  Administered 2015-09-10: 2.5 mg via RESPIRATORY_TRACT

## 2015-09-10 MED ORDER — PREDNISONE 20 MG PO TABS
40.0000 mg | ORAL_TABLET | Freq: Once | ORAL | Status: AC
  Administered 2015-09-10: 40 mg via ORAL

## 2015-09-10 MED ORDER — PREDNISONE 20 MG PO TABS
ORAL_TABLET | ORAL | Status: AC
  Filled 2015-09-10: qty 2

## 2015-09-10 MED ORDER — PREDNISONE 10 MG PO TABS: ORAL_TABLET | ORAL | Status: DC

## 2015-09-10 MED ORDER — ALBUTEROL SULFATE HFA 108 (90 BASE) MCG/ACT IN AERS: 2.0000 | INHALATION_SPRAY | RESPIRATORY_TRACT | Status: DC | PRN

## 2015-09-10 MED ORDER — ALBUTEROL SULFATE (2.5 MG/3ML) 0.083% IN NEBU
INHALATION_SOLUTION | RESPIRATORY_TRACT | Status: AC
  Filled 2015-09-10: qty 3

## 2015-09-10 NOTE — ED Provider Notes (Signed)
CSN: [id number]     Arrival date & time 09/10/15  1758 History   First MD Initiated Contact with Patient 09/10/15 1824     Chief Complaint  Patient presents with  . Wheezing  . Shortness of Breath   (Consider location/radiation/quality/duration/timing/severity/associated sxs/prior Treatment) HPI Pt was seen in the ED a couple of days ago for wheezing. Given neb treatment and sent home. Ibuprofen and tessalon for cough. meds are not helping. Still wheezing. Sob today. Recent move to [place] from [place], no PCP here yet, no history of asthma. Past Medical History  Diagnosis Date  . Headache    Past Surgical History  Procedure Laterality Date  . Foot surgery Right     Had screws and plate after fx, recently removed plate and screws  . Breast cyst excision     History reviewed. No pertinent family history. Social History  Substance Use Topics  . Smoking status: Never Smoker   . Smokeless tobacco: Never Used  . Alcohol Use: No   OB History    Gravida Para Term Preterm AB TAB SAB Ectopic Multiple Living   3 3 3       3      Review of Systems wheezing  Allergies  Review of patient's allergies indicates no known allergies.  Home Medications   Prior to Admission medications   Medication Sig Start Date End Date Taking? Authorizing Provider  benzonatate (TESSALON) 100 MG capsule Take 1 capsule (100 mg total) by mouth every 8 (eight) hours. 09/08/15  Yes Fransico Meadow, PA-C  albuterol (PROVENTIL HFA;VENTOLIN HFA) 108 (90 Base) MCG/ACT inhaler Inhale 2 puffs into the lungs every 4 (four) hours as needed for wheezing or shortness of breath. 09/10/15   Konrad Felix, PA  ibuprofen (ADVIL,MOTRIN) 800 MG tablet Take 1 tablet (800 mg total) by mouth 3 (three) times daily. 09/08/15   Fransico Meadow, PA-C  metroNIDAZOLE (FLAGYL) 500 MG tablet Take 1 tablet (500 mg total) by mouth 2 (two) times daily. 10/29/14   Gwen Pounds, CNM  predniSONE (DELTASONE) 10 MG tablet Sig: 4 tables once a day for 3  days, 3 tablets once a day X3 days, 2 tablets a day for 3 days, 1 tablet a day for 3 days. 09/10/15   Konrad Felix, PA  terconazole (TERAZOL 7) 0.4 % vaginal cream Place 1 applicator vaginally at bedtime. 10/29/14   Gwen Pounds, CNM   Meds Ordered and Administered this Visit   Medications  albuterol (PROVENTIL) (2.5 MG/3ML) 0.083% nebulizer solution 2.5 mg (2.5 mg Nebulization Given 09/10/15 1828)  albuterol (PROVENTIL) (2.5 MG/3ML) 0.083% nebulizer solution 2.5 mg (2.5 mg Nebulization Given 09/10/15 1906)  predniSONE (DELTASONE) tablet 40 mg (40 mg Oral Given 09/10/15 1906)    BP 118/65 mmHg  Pulse 79  Temp(Src) 98.8 F (37.1 C) (Oral)  Resp 18  SpO2 100%  LMP 08/29/2015 (Approximate) No data found.   Physical Exam  ED Course  Procedures (including critical care time)  Labs Review Labs Reviewed - No data to display  Imaging Review Dg Chest 2 View  09/08/2015  CLINICAL DATA:  Cough and congestion for 3 days. EXAM: CHEST  2 VIEW COMPARISON:  None. FINDINGS: The heart size and mediastinal contours are within normal limits. Both lungs are clear. The visualized skeletal structures are unremarkable. IMPRESSION: No active cardiopulmonary disease. Electronically Signed   By: Andreas Newport M.D.   On: 09/08/2015 21:57     Visual Acuity Review  Right Eye  Distance:   Left Eye Distance:   Bilateral Distance:    Right Eye Near:   Left Eye Near:    Bilateral Near:     Ts with nebs and prednisone, speech is clear sats 100%, feels much better.   rx albuterol prednisone  MDM   1. Reactive airway disease with wheezing with acute exacerbation     Patient is reassured that there are no issues that require transfer to higher level of care at this time or additional tests. Patient is advised to continue home symptomatic treatment. Patient is advised that if there are new or worsening symptoms to attend the emergency department, contact primary care provider, or return to  [facility]. Instructions of care provided discharged home in stable condition.    THIS NOTE WAS GENERATED USING A VOICE RECOGNITION SOFTWARE PROGRAM. ALL REASONABLE EFFORTS  WERE MADE TO PROOFREAD THIS DOCUMENT FOR ACCURACY.  I have verbally reviewed the discharge instructions with the patient. A printed AVS was given to the patient.  All questions were answered prior to discharge.      Konrad Felix, [place] 09/10/15 1943

## 2015-09-10 NOTE — Discharge Instructions (Signed)
Bronchospasm, Adult  A bronchospasm is a spasm or tightening of the airways going into the lungs. During a bronchospasm breathing becomes more difficult because the airways get smaller. When this happens there can be coughing, a whistling sound when breathing (wheezing), and difficulty breathing. Bronchospasm is often associated with asthma, but not all patients who experience a bronchospasm have asthma.  CAUSES   A bronchospasm is caused by inflammation or irritation of the airways. The inflammation or irritation may be triggered by:   · Allergies (such as to animals, pollen, food, or mold). Allergens that cause bronchospasm may cause wheezing immediately after exposure or many hours later.    · Infection. Viral infections are believed to be the most common cause of bronchospasm.    · Exercise.    · Irritants (such as pollution, cigarette smoke, strong odors, aerosol sprays, and paint fumes).    · Weather changes. Winds increase molds and pollens in the air. Rain refreshes the air by washing irritants out. Cold air may cause inflammation.    · Stress and emotional upset.    SIGNS AND SYMPTOMS   · Wheezing.    · Excessive nighttime coughing.    · Frequent or severe coughing with a simple cold.    · Chest tightness.    · Shortness of breath.    DIAGNOSIS   Bronchospasm is usually diagnosed through a history and physical exam. Tests, such as chest X-rays, are sometimes done to look for other conditions.  TREATMENT   · Inhaled medicines can be given to open up your airways and help you breathe. The medicines can be given using either an inhaler or a nebulizer machine.  · Corticosteroid medicines may be given for severe bronchospasm, usually when it is associated with asthma.  HOME CARE INSTRUCTIONS   · Always have a plan prepared for seeking medical care. Know when to call your health care provider and local emergency services (911 in the [place] Know where you can access local emergency care.  · Only take medicines as  directed by your health care provider.  · If you were prescribed an inhaler or nebulizer machine, ask your health care provider to explain how to use it correctly. Always use a spacer with your inhaler if you were given one.  · It is necessary to remain calm during an attack. Try to relax and breathe more slowly.   · Control your home environment in the following ways:      Change your heating and air conditioning filter at least once a month.      Limit your use of fireplaces and wood stoves.    Do not smoke and do not allow smoking in your home.      Avoid exposure to perfumes and fragrances.      Get rid of pests (such as roaches and mice) and their droppings.      Throw away plants if you see mold on them.      Keep your house clean and dust free.      Replace carpet with wood, tile, or vinyl flooring. Carpet can trap dander and dust.      Use allergy-proof pillows, mattress covers, and box spring covers.      Wash bed sheets and blankets every week in hot water and dry them in a dryer.      Use blankets that are made of polyester or cotton.      Wash hands frequently.  SEEK MEDICAL CARE IF:   · You have muscle aches.    · You have chest pain.    · The sputum changes from clear or   white to yellow, green, gray, or bloody.    · The sputum you cough up gets thicker.    · There are problems that may be related to the medicine you are given, such as a rash, itching, swelling, or trouble breathing.    SEEK IMMEDIATE MEDICAL CARE IF:   · You have worsening wheezing and coughing even after taking your prescribed medicines.    · You have increased difficulty breathing.    · You develop severe chest pain.  MAKE SURE YOU:   · Understand these instructions.  · Will watch your condition.  · Will get help right away if you are not doing well or get worse.     This information is not intended to replace advice given to you by your health care provider. Make sure you discuss any questions you have with your health care  provider.     Document Released: 05/20/2003 Document Revised: 06/07/2014 Document Reviewed: 11/06/2012  Elsevier Interactive Patient Education ©2016 Elsevier Inc.

## 2015-09-10 NOTE — ED Notes (Signed)
The patient presented to the [facility] with a complaint of shortness of breath and wheezing. The patient stated that she was treated with a breathing treatment in the ED 2 days ago but has gotten worse. The patient did have audible inspiratory wheezing.

## 2015-10-24 ENCOUNTER — Encounter (HOSPITAL_COMMUNITY): Payer: Self-pay | Admitting: *Deleted

## 2015-10-24 ENCOUNTER — Ambulatory Visit (HOSPITAL_COMMUNITY)
Admission: EM | Admit: 2015-10-24 | Discharge: 2015-10-24 | Disposition: A | Discharge status: Home / Self Care | Payer: Managed Care, Other (non HMO) | Attending: Emergency Medicine | Admitting: Emergency Medicine

## 2015-10-24 DIAGNOSIS — M6248 Contracture of muscle, other site: Secondary | ICD-10-CM

## 2015-10-24 DIAGNOSIS — M778 Other enthesopathies, not elsewhere classified: Secondary | ICD-10-CM

## 2015-10-24 DIAGNOSIS — M25511 Pain in right shoulder: Secondary | ICD-10-CM | POA: Diagnosis not present

## 2015-10-24 DIAGNOSIS — M62838 Other muscle spasm: Secondary | ICD-10-CM

## 2015-10-24 DIAGNOSIS — M7581 Other shoulder lesions, right shoulder: Secondary | ICD-10-CM

## 2015-10-24 MED ORDER — CYCLOBENZAPRINE HCL 5 MG PO TABS: ORAL_TABLET | ORAL | Status: DC

## 2015-10-24 MED ORDER — DICLOFENAC SODIUM 75 MG PO TBEC: 75.0000 mg | DELAYED_RELEASE_TABLET | Freq: Two times a day (BID) | ORAL | Status: DC

## 2015-10-24 NOTE — ED Provider Notes (Signed)
CSN: [id number]     Arrival date & time 10/24/15  1630 History   First MD Initiated Contact with Patient 10/24/15 1650     Chief Complaint  Patient presents with  . Arm Problem   (Consider location/radiation/quality/duration/timing/severity/associated sxs/prior Treatment) HPI Comments: Patient presents with left shoulder and neck pain. Onset x 2 weeks. No injury. 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 Pain with lifting and ROM of the arm. Now some tightness in her neck. No weakness, but some periodic numbness in the upper arm.   The history is provided by the patient.      Past Medical History  Diagnosis Date  . Headache    Past Surgical History  Procedure Laterality Date  . Foot surgery Right     Had screws and plate after fx, recently removed plate and screws  . Breast cyst excision     History reviewed. No pertinent family history. Social History  Substance Use Topics  . Smoking status: Never Smoker   . Smokeless tobacco: Never Used  . Alcohol Use: No   OB History    Gravida Para Term Preterm AB TAB SAB Ectopic Multiple Living   3 3 3       3      Review of Systems  Musculoskeletal: Positive for myalgias, arthralgias, neck pain and neck stiffness. Negative for joint swelling.  Skin: Negative for rash.  Neurological: Negative for weakness.    Allergies  Review of patient's allergies indicates no known allergies.  Home Medications   Prior to Admission medications   Medication Sig Start Date End Date Taking? Authorizing Provider  albuterol (PROVENTIL HFA;VENTOLIN HFA) 108 (90 Base) MCG/ACT inhaler Inhale 2 puffs into the lungs every 4 (four) hours as needed for wheezing or shortness of breath. 09/10/15   Konrad Felix, PA  benzonatate (TESSALON) 100 MG capsule Take 1 capsule (100 mg total) by mouth every 8 (eight) hours. 09/08/15   Fransico Meadow, PA-C  cyclobenzaprine (FLEXERIL) 5 MG tablet 1-2 tablets po q 8 hours as needed  for muscle spasm 10/24/15   Bjorn Pippin, PA-C  diclofenac (VOLTAREN) 75 MG EC tablet Take 1 tablet (75 mg total) by mouth 2 (two) times daily. 10/24/15   Bjorn Pippin, PA-C  ibuprofen (ADVIL,MOTRIN) 800 MG tablet Take 1 tablet (800 mg total) by mouth 3 (three) times daily. 09/08/15   Fransico Meadow, PA-C  metroNIDAZOLE (FLAGYL) 500 MG tablet Take 1 tablet (500 mg total) by mouth 2 (two) times daily. 10/29/14   Gwen Pounds, CNM  predniSONE (DELTASONE) 10 MG tablet Sig: 4 tables once a day for 3 days, 3 tablets once a day X3 days, 2 tablets a day for 3 days, 1 tablet a day for 3 days. 09/10/15   Konrad Felix, PA  terconazole (TERAZOL 7) 0.4 % vaginal cream Place 1 applicator vaginally at bedtime. 10/29/14   Gwen Pounds, CNM   Meds Ordered and Administered this Visit  Medications - No data to display  BP 116/87 mmHg  Pulse 78  Temp(Src) 98.6 F (37 C) (Oral)  Resp 18  SpO2 100%  LMP 10/24/2015 No data found.   Physical Exam  Constitutional: She is oriented to person, place, and time. She appears well-developed and well-nourished. No distress.  Guarding to the left shoulder  Neck: Neck supple.  Pain with lateral left rotation of the cervical spine, no specific spasms noted  Musculoskeletal:  Limited and painful ROM  of the left shoulder to IR, FF and abduction. No effusion. Positive impingement.5/5 Motor to left UE and sensation intact  Neurological: She is alert and oriented to person, place, and time. She displays normal reflexes. She exhibits normal muscle tone.  Skin: Skin is warm and dry. She is not diaphoretic.  Psychiatric: Her behavior is normal.  Nursing note and vitals reviewed.   ED Course  Procedures (including critical care time)  Labs Review Labs Reviewed - No data to display  Imaging Review No results found.   Visual Acuity Review  Right Eye Distance:   Left Eye Distance:   Bilateral Distance:    Right Eye Near:   Left Eye Near:    Bilateral  Near:         MDM   1. Shoulder tendonitis, right   2. Shoulder pain, acute, right   3. Muscle spasms of neck    Exam suggest RTC tendonitis with associated left cervica spasms. No neuro deficit. Treat with ice, gentle ROM, NSAID's and prn flexeril. If worsens f/u with Orthopedics. 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     Bjorn Pippin, PA-C 10/24/15 1717

## 2015-10-24 NOTE — ED Notes (Signed)
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

## 2015-10-24 NOTE — Discharge Instructions (Signed)
Joint Pain  You have rotator cuff tendonitis and possibly some neck spasms, probably from the type of work you do and being busy. Take the diclofenac twice a day with food for 5-7 days, then use the flexeril as needed for muscle spasms. Ice to area 3-4 times a day will help. Do the gentle ROM exercises I showed you, you do not want to keep this shoulder still completely. If you do not improve in a week or so, then please f/u with the orthopedics.     Joint pain, which is also called arthralgia, can be caused by many things. Joint pain often goes away when you follow your health care provider's instructions for relieving pain at home. However, joint pain can also be caused by conditions that require further treatment. Common causes of joint pain include:  Bruising in the area of the joint.  Overuse of the joint.  Wear and tear on the joints that occur with aging (osteoarthritis).  Various other forms of arthritis.  A buildup of a crystal form of uric acid in the joint (gout).  Infections of the joint (septic arthritis) or of the bone (osteomyelitis). Your health care provider may recommend medicine to help with the pain. If your joint pain continues, additional tests may be needed to diagnose your condition. HOME CARE INSTRUCTIONS Watch your condition for any changes. Follow these instructions as directed to lessen the pain that you are feeling.  Take medicines only as directed by your health care provider.  Rest the affected area for as long as your health care provider says that you should. If directed to do so, raise the painful joint above the level of your heart while you are sitting or lying down.  Do not do things that cause or worsen pain.  If directed, apply ice to the painful area:  Put ice in a plastic bag.  Place a towel between your skin and the bag.  Leave the ice on for 20 minutes, 2-3 times per day.  Wear an elastic bandage, splint, or sling as directed by your  health care provider. Loosen the elastic bandage or splint if your fingers or toes become numb and tingle, or if they turn cold and blue.  Begin exercising or stretching the affected area as directed by your health care provider. Ask your health care provider what types of exercise are safe for you.  Keep all follow-up visits as directed by your health care provider. This is important. SEEK MEDICAL CARE IF:  Your pain increases, and medicine does not help.  Your joint pain does not improve within 3 days.  You have increased bruising or swelling.  You have a fever.  You lose 10 lb (4.5 kg) or more without trying. SEEK IMMEDIATE MEDICAL CARE IF:  You are not able to move the joint.  Your fingers or toes become numb or they turn cold and blue.   This information is not intended to replace advice given to you by your health care provider. Make sure you discuss any questions you have with your health care provider.   Document Released: 05/17/2005 Document Revised: 06/07/2014 Document Reviewed: 02/26/2014 Elsevier Interactive Patient Education 2016 Elsevier Inc.  Shoulder Pain The shoulder is the joint that connects your arm to your body. Muscles and band-like tissues that connect bones to muscles (tendons) hold the joint together. Shoulder pain is felt if an injury or medical problem affects one or more parts of the shoulder. HOME CARE   Put  ice on the sore area.  Put ice in a plastic bag.  Place a towel between your skin and the bag.  Leave the ice on for 15-20 minutes, 03-04 times a day for the first 2 days.  Stop using cold packs if they do not help with the pain.  If you were given something to keep your shoulder from moving (sling; shoulder immobilizer), wear it as told. Only take it off to shower or bathe.  Move your arm as little as possible, but keep your hand moving to prevent puffiness (swelling).  Squeeze a soft ball or foam pad as much as possible to help prevent  swelling.  Take medicine as told by your doctor. GET HELP IF:  You have progressing new pain in your arm, hand, or fingers.  Your hand or fingers get cold.  Your medicine does not help lessen your pain. GET HELP RIGHT AWAY IF:   Your arm, hand, or fingers are numb or tingling.  Your arm, hand, or fingers are puffy (swollen), painful, or turn white or blue. MAKE SURE YOU:   Understand these instructions.  Will watch your condition.  Will get help right away if you are not doing well or get worse.   This information is not intended to replace advice given to you by your health care provider. Make sure you discuss any questions you have with your health care provider.   Document Released: 11/03/2007 Document Revised: 06/07/2014 Document Reviewed: 09/09/2014 Elsevier Interactive Patient Education [employer].

## 2016-04-06 ENCOUNTER — Other Ambulatory Visit: Payer: Self-pay | Admitting: Family

## 2016-05-04 ENCOUNTER — Encounter (HOSPITAL_BASED_OUTPATIENT_CLINIC_OR_DEPARTMENT_OTHER): Payer: Self-pay | Admitting: Orthopedic Surgery

## 2016-09-13 ENCOUNTER — Encounter (HOSPITAL_COMMUNITY): Payer: Self-pay | Admitting: Emergency Medicine

## 2016-09-13 ENCOUNTER — Ambulatory Visit (INDEPENDENT_AMBULATORY_CARE_PROVIDER_SITE_OTHER): Payer: Managed Care, Other (non HMO)

## 2016-09-13 ENCOUNTER — Ambulatory Visit (HOSPITAL_COMMUNITY)
Admission: EM | Admit: 2016-09-13 | Discharge: 2016-09-13 | Disposition: A | Discharge status: Home / Self Care | Payer: Managed Care, Other (non HMO) | Attending: Internal Medicine | Admitting: Internal Medicine

## 2016-09-13 DIAGNOSIS — J4541 Moderate persistent asthma with (acute) exacerbation: Secondary | ICD-10-CM | POA: Diagnosis not present

## 2016-09-13 DIAGNOSIS — J309 Allergic rhinitis, unspecified: Secondary | ICD-10-CM | POA: Diagnosis not present

## 2016-09-13 DIAGNOSIS — R062 Wheezing: Secondary | ICD-10-CM

## 2016-09-13 DIAGNOSIS — R0602 Shortness of breath: Secondary | ICD-10-CM

## 2016-09-13 DIAGNOSIS — J302 Other seasonal allergic rhinitis: Secondary | ICD-10-CM

## 2016-09-13 MED ORDER — ALBUTEROL SULFATE HFA 108 (90 BASE) MCG/ACT IN AERS: 1.0000 | INHALATION_SPRAY | Freq: Four times a day (QID) | RESPIRATORY_TRACT | 2 refills | Status: DC | PRN

## 2016-09-13 MED ORDER — PREDNISONE 50 MG PO TABS: ORAL_TABLET | ORAL | 0 refills | Status: AC

## 2016-09-13 MED ORDER — IPRATROPIUM-ALBUTEROL 0.5-2.5 (3) MG/3ML IN SOLN
RESPIRATORY_TRACT | Status: AC
  Filled 2016-09-13: qty 3

## 2016-09-13 MED ORDER — IPRATROPIUM-ALBUTEROL 0.5-2.5 (3) MG/3ML IN SOLN
3.0000 mL | Freq: Once | RESPIRATORY_TRACT | Status: AC
  Administered 2016-09-13: 3 mL via RESPIRATORY_TRACT

## 2016-09-13 MED ORDER — MONTELUKAST SODIUM 10 MG PO TABS: 10.0000 mg | ORAL_TABLET | Freq: Every day | ORAL | 2 refills | Status: AC

## 2016-09-13 NOTE — ED Triage Notes (Signed)
PT reports SOB for three days. PT has no history of asthma. PT believes she is having allergy troubles. PT has inspiratory and expiratory wheezing, dyspnea at rest, and is tachypnic.

## 2016-09-13 NOTE — ED Provider Notes (Signed)
CSN: [id number]     Arrival date & time 09/13/16  1856 History   First MD Initiated Contact with Patient 09/13/16 1929     Chief Complaint  Patient presents with  . Shortness of Breath   (Consider location/radiation/quality/duration/timing/severity/associated sxs/prior Treatment) 36 year old female presents to clinic for evaluation of shortness of breath and wheezing. She states she does not smoke, does not smoke cigarettes or marijuana, has no history of asthma, states she had one episode similar to this last year in the spring.   The history is provided by the patient.  Shortness of Breath  Severity:  Severe Onset quality:  Gradual Duration:  2 days Timing:  Constant Progression:  Worsening Chronicity:  Recurrent Context: known allergens, pollens and weather changes   Context: not animal exposure and not smoke exposure   Relieved by:  Nothing Worsened by:  Nothing Ineffective treatments:  None tried Associated symptoms: cough and wheezing   Associated symptoms: no chest pain, no fever, no headaches, no neck pain, no rash, no sore throat, no syncope, no swollen glands and no vomiting     Past Medical History:  Diagnosis Date  . Headache    migraines/stress  . Headache   . Painful orthopaedic hardware [facility]) 07/2014   right foot   Past Surgical History:  Procedure Laterality Date  . BREAST CYST EXCISION Left   . BREAST CYST EXCISION    . FOOT SURGERY Right    Had screws and plate after fx, recently removed plate and screws  . HARDWARE REMOVAL Right 07/18/2014   Procedure: REMOVAL OF DEEP IMPLANTS FROM DORSAL, MEDIAL AND LATERAL RIGHT FOOT;  Surgeon: Wylene Simmer, MD;  Location: [place];  Service: Orthopedics;  Laterality: Right;  . MASS EXCISION Left 11/14/2013   Procedure: Excision left axillary mass ;  Surgeon: Stark Klein, MD;  Location: [place];  Service: General;  Laterality: Left;  . ORIF FOOT FRACTURE Right    No family history on  file. Social History  Substance Use Topics  . Smoking status: Never Smoker  . Smokeless tobacco: Never Used  . Alcohol use No   OB History    Gravida Para Term Preterm AB Living   3 3 3  0 0     SAB TAB Ectopic Multiple Live Births   0 0 0         Review of Systems  Constitutional: Negative for appetite change, chills and fever.  HENT: Positive for congestion. Negative for sinus pain, sinus pressure and sore throat.   Respiratory: Positive for cough, shortness of breath and wheezing.   Cardiovascular: Negative for chest pain and syncope.  Gastrointestinal: Negative for diarrhea, nausea and vomiting.  Genitourinary: Negative.  Negative for frequency and urgency.  Musculoskeletal: Negative.  Negative for neck pain and neck stiffness.  Skin: Negative for pallor and rash.  Neurological: Negative for dizziness, weakness and headaches.  All other systems reviewed and are negative.   Allergies  Patient has no known allergies.  Home Medications   Prior to Admission medications   Medication Sig Start Date End Date Taking? Authorizing Provider  albuterol (PROVENTIL HFA;VENTOLIN HFA) 108 (90 Base) MCG/ACT inhaler Inhale 1-2 puffs into the lungs every 6 (six) hours as needed for wheezing or shortness of breath. 09/13/16   Barnet Glasgow, NP  montelukast (SINGULAIR) 10 MG tablet Take 1 tablet (10 mg total) by mouth at bedtime. 09/13/16   Barnet Glasgow, NP  predniSONE (DELTASONE) 50 MG tablet Take 1 tablet  daily with food 09/13/16   Barnet Glasgow, NP   Meds Ordered and Administered this Visit   Medications  ipratropium-albuterol (DUONEB) 0.5-2.5 (3) MG/3ML nebulizer solution 3 mL (3 mLs Nebulization Given 09/13/16 1929)  ipratropium-albuterol (DUONEB) 0.5-2.5 (3) MG/3ML nebulizer solution 3 mL (3 mLs Nebulization Given 09/13/16 2006)    BP (!) 153/93   Pulse 96   Temp 98.2 F (36.8 C) (Oral)   Resp (!) 26   Wt 185 lb (83.9 kg)   SpO2 98%   BMI 28.98 kg/m  No data  found.   Physical Exam  Constitutional: She is oriented to person, place, and time. She appears well-developed and well-nourished.  HENT:  Head: Normocephalic and atraumatic.  Right Ear: External ear normal.  Left Ear: External ear normal.  Eyes: Conjunctivae are normal. Right eye exhibits no discharge. Left eye exhibits no discharge.  Neck: Normal range of motion.  Cardiovascular: Normal rate and regular rhythm.   Pulmonary/Chest: No stridor. Tachypnea noted. She has wheezes.  Abdominal: Soft. Bowel sounds are normal.  Lymphadenopathy:    She has no cervical adenopathy.  Neurological: She is alert and oriented to person, place, and time.  Skin: Skin is warm. Capillary refill takes less than 2 seconds. She is not diaphoretic.  Psychiatric: She has a normal mood and affect. Her behavior is normal.  Nursing note and vitals reviewed.   Urgent Care Course     Procedures (including critical care time)  Labs Review Labs Reviewed - No data to display  Imaging Review Dg Chest 2 View  Result Date: 09/13/2016 CLINICAL DATA:  Patient states that she started having cough and wheezing 3 days ago, with sob and chest pain. No priors EXAM: CHEST  2 VIEW COMPARISON:  None. FINDINGS: The heart size and mediastinal contours are within normal limits. Both lungs are clear. The visualized skeletal structures are unremarkable. IMPRESSION: No active cardiopulmonary disease. Lung volumes are normal. No evidence of pneumonia or pulmonary edema. Electronically Signed   By: Franki Cabot M.D.   On: 09/13/2016 19:53       MDM   1. Moderate persistent reactive airway disease with wheezing with acute exacerbation   2. Seasonal allergic rhinitis, unspecified trigger    Patient was given 2 DuoNeb treatments in clinic, along with dexamethasone and had significant improvement in her symptoms. Discharged home with an albuterol inhaler, prednisone, and Singulair. Encouraged to start taking an antihistamine  like Claritin or Zyrtec daily. Also encouraged her to establish with a primary care provider for further monitoring and evaluation of her condition, as she may have asthma.     Barnet Glasgow, NP 09/13/16 2023

## 2016-09-13 NOTE — Discharge Instructions (Signed)
I am treating you for reactive airway disease, most likely related to seasonal allergies. I have prescribed albuterol inhaler, prednisone, and singulair. I also recommend you take an over the counter antihistamine such as Claritin or Zyrtec. Should your symptoms persist, or fail to improve, follow up with primary care provider or return to clinic. If your breathing worsens go to the emergency room.

## 2017-12-11 ENCOUNTER — Emergency Department (HOSPITAL_COMMUNITY): Payer: Managed Care, Other (non HMO)

## 2017-12-11 ENCOUNTER — Encounter (HOSPITAL_COMMUNITY): Payer: Self-pay | Admitting: Emergency Medicine

## 2017-12-11 ENCOUNTER — Emergency Department (HOSPITAL_COMMUNITY)
Admission: EM | Admit: 2017-12-11 | Discharge: 2017-12-12 | Disposition: A | Discharge status: Home / Self Care | Payer: Managed Care, Other (non HMO) | Attending: Emergency Medicine | Admitting: Emergency Medicine

## 2017-12-11 ENCOUNTER — Other Ambulatory Visit: Payer: Self-pay

## 2017-12-11 DIAGNOSIS — Z79899 Other long term (current) drug therapy: Secondary | ICD-10-CM | POA: Diagnosis not present

## 2017-12-11 DIAGNOSIS — M545 Low back pain, unspecified: Secondary | ICD-10-CM

## 2017-12-11 DIAGNOSIS — M25562 Pain in left knee: Secondary | ICD-10-CM | POA: Diagnosis not present

## 2017-12-11 MED ORDER — CYCLOBENZAPRINE HCL 10 MG PO TABS
5.0000 mg | ORAL_TABLET | Freq: Once | ORAL | Status: AC
  Administered 2017-12-12: 5 mg via ORAL
  Filled 2017-12-11: qty 1

## 2017-12-11 MED ORDER — KETOROLAC TROMETHAMINE 60 MG/2ML IM SOLN
60.0000 mg | Freq: Once | INTRAMUSCULAR | Status: AC
Start: 2017-12-11 — End: 2017-12-11
  Administered 2017-12-11: 60 mg via INTRAMUSCULAR
  Filled 2017-12-11: qty 2

## 2017-12-11 NOTE — ED Triage Notes (Signed)
Pt reports having lower back pain since 12/06/17 and pain in left knee. Denies any injury.

## 2017-12-11 NOTE — ED Notes (Signed)
Patient transported to X-ray 

## 2017-12-12 MED ORDER — NAPROXEN 500 MG PO TABS: ORAL_TABLET | ORAL | 0 refills | Status: AC

## 2017-12-12 MED ORDER — CYCLOBENZAPRINE HCL 5 MG PO TABS: 5.0000 mg | ORAL_TABLET | Freq: Three times a day (TID) | ORAL | 0 refills | Status: DC | PRN

## 2017-12-12 NOTE — ED Provider Notes (Signed)
[place] DEPT Provider Note   CSN: [id number] Arrival date & time: 12/11/17  2151  Time seen 23:28 PM    History   Chief Complaint Chief Complaint  Patient presents with  . Back Pain    HPI Kaitlyn Valentine is a 37 y.o. female.  HPI patient states she started having low back pain on July 9.  She denies any change in her activity or any injury.  She states the pain is sharp and she denies it going into her legs however she states she has had left knee pain and swelling for a week and it started before the back pain.  She has used ice on it which has helped.  She states when she walks she hears a crackling sound.  She points to the joint space on both sides as to where her pain is.  She states flexing her knee makes it worse.  She denies any incontinence.  She states changing positions makes her back pain worse, nothing makes it feel better.  She has used ice on her back without relief.  She states less than a year ago she had slipped and fell and she had some low back pain then.  She was seen at the [facility] urgent [facility] and had physical therapy for about 8 weeks and she got better.  PCP Patient, No Pcp Per   Past Medical History:  Diagnosis Date  . Headache    migraines/stress  . Headache   . Painful orthopaedic hardware [facility]) 07/2014   right foot    Patient Active Problem List   Diagnosis Date Noted  . Axillary mass, left 09/12/2013    Past Surgical History:  Procedure Laterality Date  . BREAST CYST EXCISION Left   . BREAST CYST EXCISION    . FOOT SURGERY Right    Had screws and plate after fx, recently removed plate and screws  . HARDWARE REMOVAL Right 07/18/2014   Procedure: REMOVAL OF DEEP IMPLANTS FROM DORSAL, MEDIAL AND LATERAL RIGHT FOOT;  Surgeon: Wylene Simmer, MD;  Location: [place];  Service: Orthopedics;  Laterality: Right;  . MASS EXCISION Left 11/14/2013   Procedure: Excision left axillary mass ;   Surgeon: Stark Klein, MD;  Location: [place];  Service: General;  Laterality: Left;  . ORIF FOOT FRACTURE Right      OB History    Gravida  3   Para  3   Term  3   Preterm  0   AB  0   Living        SAB  0   TAB  0   Ectopic  0   Multiple      Live Births               Home Medications    Prior to Admission medications   Medication Sig Start Date End Date Taking? Authorizing Provider  albuterol (PROVENTIL HFA;VENTOLIN HFA) 108 (90 Base) MCG/ACT inhaler Inhale 1-2 puffs into the lungs every 6 (six) hours as needed for wheezing or shortness of breath. 09/13/16   Barnet Glasgow, NP  cyclobenzaprine (FLEXERIL) 5 MG tablet Take 1 tablet (5 mg total) by mouth 3 (three) times daily as needed for muscle spasms. 12/12/17   Rolland Porter, MD  montelukast (SINGULAIR) 10 MG tablet Take 1 tablet (10 mg total) by mouth at bedtime. 09/13/16   Barnet Glasgow, NP  naproxen (NAPROSYN) 500 MG tablet Take 1 po BID with  food prn pain 12/12/17   Rolland Porter, MD  predniSONE (DELTASONE) 50 MG tablet Take 1 tablet daily with food 09/13/16   Barnet Glasgow, NP    Family History History reviewed. No pertinent family history.  Social History Social History   Tobacco Use  . Smoking status: Never Smoker  . Smokeless tobacco: Never Used  Substance Use Topics  . Alcohol use: No  . Drug use: No  employed   Allergies   Patient has no known allergies.   Review of Systems Review of Systems  All other systems reviewed and are negative.    Physical Exam Updated Vital Signs BP (!) 107/59   Pulse 66   Temp 98.9 F (37.2 C) (Oral)   Resp 15   Ht 5\' 8"  (1.727 m)   Wt 90.7 kg (200 lb)   LMP 12/11/2017   SpO2 100%   BMI 30.41 kg/m   Vital signs normal    Physical Exam  Constitutional: She is oriented to person, place, and time. She appears well-developed and well-nourished. She appears distressed.  Appears uncomfortable when she changes positions.    HENT:  Head: Normocephalic and atraumatic.  Right Ear: External ear normal.  Left Ear: External ear normal.  Nose: Nose normal.  Eyes: Conjunctivae and EOM are normal.  Neck: Normal range of motion.  Cardiovascular: Normal rate.  Pulmonary/Chest: Effort normal. No respiratory distress.  Musculoskeletal: She exhibits edema and tenderness.       Back:  Tender to palpation in the lower sacral/lumbar area, but is more tender over the SIJ bilaterally. Hurts to have lumbar flexion to the left with pain in her lateral right back/flank. Reflexes patellar 1+ on the right 1/2+ on the left.  Has a ? Small joint effusion of swelling of the left knee with tenderness in the joint space bilaterally.   Neurological: She is alert and oriented to person, place, and time. No cranial nerve deficit.  Skin: Skin is warm and dry. No erythema.  Psychiatric: She has a normal mood and affect. Her behavior is normal. Judgment and thought content normal.  Nursing note and vitals reviewed.    ED Treatments / Results  Labs (all labs ordered are listed, but only abnormal results are displayed) Labs Reviewed - No data to display  EKG None  Radiology Dg Knee Complete 4 Views Left  Result Date: 12/12/2017 CLINICAL DATA:  Left knee pain for 1 week without known injury. EXAM: LEFT KNEE - COMPLETE 4+ VIEW COMPARISON:  None. FINDINGS: No evidence of fracture, dislocation, or joint effusion. No evidence of arthropathy or other focal bone abnormality. Soft tissues are unremarkable. IMPRESSION: Negative. Electronically Signed   By: Ashley Royalty M.D.   On: 12/12/2017 00:04    Procedures Procedures (including critical care time)  Medications Ordered in ED Medications  ketorolac (TORADOL) injection 60 mg (60 mg Intramuscular Given 12/11/17 2359)  cyclobenzaprine (FLEXERIL) tablet 5 mg (5 mg Oral Given 12/12/17 0000)     Initial Impression / Assessment and Plan / ED Course  I have reviewed the triage vital signs and  the nursing notes.  Pertinent labs & imaging results that were available during my care of the patient were reviewed by me and considered in my medical decision making (see chart for details).    Patient was given Toradol IM and Flexeril orally.  X-ray was obtained of her left knee to look for underlying arthritis.  X-rays were not obtained of her back since there was no preceding injury.  Patient was discharged home with conservative treatment including ice and heat for comfort, anti-inflammatory muscle relaxer.  She was referred to an orthopedist to follow-up with for her back and her knee if they are not improving.   Final Clinical Impressions(s) / ED Diagnoses   Final diagnoses:  Acute bilateral low back pain without sciatica  Acute pain of left knee    ED Discharge Orders        Ordered    naproxen (NAPROSYN) 500 MG tablet     12/12/17 0039    cyclobenzaprine (FLEXERIL) 5 MG tablet  3 times daily PRN     12/12/17 0039      Plan discharge  Rolland Porter, MD, Barbette Or, MD 12/12/17 [phone]

## 2017-12-12 NOTE — Discharge Instructions (Addendum)
Use ice and heat for comfort. Take the medications as prescribed. Follow up with an orthopedist about your back and knee if they aren't improving on the medication.

## 2018-08-22 ENCOUNTER — Other Ambulatory Visit (HOSPITAL_COMMUNITY)
Admission: RE | Admit: 2018-08-22 | Discharge: 2018-08-22 | Disposition: A | Discharge status: Home / Self Care | Payer: 59 | Source: Ambulatory Visit | Attending: Internal Medicine | Admitting: Internal Medicine

## 2018-08-22 DIAGNOSIS — Z124 Encounter for screening for malignant neoplasm of cervix: Secondary | ICD-10-CM | POA: Insufficient documentation

## 2018-08-23 ENCOUNTER — Other Ambulatory Visit: Payer: Self-pay | Admitting: Internal Medicine

## 2018-08-29 LAB — CYTOLOGY - PAP
Diagnosis: NEGATIVE
HPV (WINDOPATH): DETECTED — AB
HPV 16/18/45 genotyping: NEGATIVE

## 2019-07-30 ENCOUNTER — Ambulatory Visit
Admission: RE | Admit: 2019-07-30 | Discharge: 2019-07-30 | Disposition: A | Discharge status: Home / Self Care | Payer: 59 | Source: Ambulatory Visit | Attending: Internal Medicine | Admitting: Internal Medicine

## 2019-07-30 ENCOUNTER — Other Ambulatory Visit: Payer: Self-pay | Admitting: Internal Medicine

## 2019-07-30 DIAGNOSIS — M5442 Lumbago with sciatica, left side: Secondary | ICD-10-CM

## 2019-08-28 ENCOUNTER — Other Ambulatory Visit: Payer: Self-pay | Admitting: Sports Medicine

## 2019-08-28 DIAGNOSIS — M545 Low back pain, unspecified: Secondary | ICD-10-CM

## 2019-09-21 ENCOUNTER — Other Ambulatory Visit: Payer: Self-pay

## 2019-09-21 ENCOUNTER — Ambulatory Visit
Admission: RE | Admit: 2019-09-21 | Discharge: 2019-09-21 | Disposition: A | Discharge status: Home / Self Care | Payer: 59 | Source: Ambulatory Visit | Attending: Sports Medicine | Admitting: Sports Medicine

## 2019-09-21 DIAGNOSIS — M545 Low back pain, unspecified: Secondary | ICD-10-CM

## 2021-08-25 ENCOUNTER — Other Ambulatory Visit (HOSPITAL_COMMUNITY)
Admission: RE | Admit: 2021-08-25 | Discharge: 2021-08-25 | Disposition: A | Discharge status: Home / Self Care | Payer: 59 | Source: Ambulatory Visit | Attending: Obstetrics and Gynecology | Admitting: Obstetrics and Gynecology

## 2021-08-25 ENCOUNTER — Other Ambulatory Visit: Payer: Self-pay | Admitting: Obstetrics and Gynecology

## 2021-08-25 ENCOUNTER — Other Ambulatory Visit: Payer: Self-pay | Admitting: Internal Medicine

## 2021-08-25 DIAGNOSIS — Z1231 Encounter for screening mammogram for malignant neoplasm of breast: Secondary | ICD-10-CM

## 2021-08-25 DIAGNOSIS — Z01419 Encounter for gynecological examination (general) (routine) without abnormal findings: Secondary | ICD-10-CM | POA: Insufficient documentation

## 2021-08-26 ENCOUNTER — Ambulatory Visit
Admission: RE | Admit: 2021-08-26 | Discharge: 2021-08-26 | Disposition: A | Discharge status: Home / Self Care | Payer: 59 | Source: Ambulatory Visit | Attending: Obstetrics and Gynecology | Admitting: Obstetrics and Gynecology

## 2021-08-26 DIAGNOSIS — Z1231 Encounter for screening mammogram for malignant neoplasm of breast: Secondary | ICD-10-CM

## 2021-08-28 LAB — CYTOLOGY - PAP
Comment: NEGATIVE
Diagnosis: NEGATIVE
Diagnosis: REACTIVE
High risk HPV: NEGATIVE

## 2022-07-27 ENCOUNTER — Other Ambulatory Visit: Payer: Self-pay | Admitting: Internal Medicine

## 2022-07-27 DIAGNOSIS — Z1231 Encounter for screening mammogram for malignant neoplasm of breast: Secondary | ICD-10-CM

## 2023-03-11 ENCOUNTER — Other Ambulatory Visit: Payer: Self-pay | Admitting: Internal Medicine

## 2023-03-11 ENCOUNTER — Ambulatory Visit
Admission: RE | Admit: 2023-03-11 | Discharge: 2023-03-11 | Disposition: A | Discharge status: Home / Self Care | Payer: 59 | Source: Ambulatory Visit | Attending: Internal Medicine | Admitting: Internal Medicine

## 2023-03-11 DIAGNOSIS — M542 Cervicalgia: Secondary | ICD-10-CM

## 2023-08-15 ENCOUNTER — Institutional Professional Consult (permissible substitution): Payer: 59 | Admitting: Plastic Surgery

## 2023-08-17 ENCOUNTER — Ambulatory Visit: Payer: 59 | Admitting: Plastic Surgery

## 2023-08-17 ENCOUNTER — Encounter: Payer: Self-pay | Admitting: Plastic Surgery

## 2023-08-17 VITALS — BP 136/91 | HR 78 | Ht 67.0 in | Wt 193.0 lb

## 2023-08-17 DIAGNOSIS — M25519 Pain in unspecified shoulder: Secondary | ICD-10-CM

## 2023-08-17 DIAGNOSIS — N62 Hypertrophy of breast: Secondary | ICD-10-CM | POA: Diagnosis not present

## 2023-08-17 DIAGNOSIS — Z1231 Encounter for screening mammogram for malignant neoplasm of breast: Secondary | ICD-10-CM

## 2023-08-17 DIAGNOSIS — M542 Cervicalgia: Secondary | ICD-10-CM | POA: Diagnosis not present

## 2023-08-17 DIAGNOSIS — G8929 Other chronic pain: Secondary | ICD-10-CM

## 2023-08-17 DIAGNOSIS — M546 Pain in thoracic spine: Secondary | ICD-10-CM | POA: Diagnosis not present

## 2023-08-17 NOTE — Progress Notes (Signed)
 Referring Provider Renford Dills, MD [address] [place],  [place] [address]   CC:  Chief Complaint  Patient presents with   Advice Only   Skin Problem      Kaitlyn Valentine is an 43 y.o. female.  HPI: Kaitlyn Valentine is a 43 year old female who presents today with complaints of upper back and neck pain of several years duration.  She feels that this is most likely due to the large size of her breast.  She states that her bras are uncomfortable and the bra straps often digging to her shoulders leaving grooves.  The size of her breast interfere with her daily activities.  She is interested in bilateral breast reduction.  Of note the patient was seen last August for atypical chest pain.  She also had pain in her left arm and neck.  The paresthesias in her left arm are being evaluated by her primary care doctor but there is no final disposition for her chest pain.  No Known Allergies  Outpatient Encounter Medications as of 08/17/2023  Medication Sig   albuterol (PROVENTIL HFA;VENTOLIN HFA) 108 (90 Base) MCG/ACT inhaler Inhale 1-2 puffs into the lungs every 6 (six) hours as needed for wheezing or shortness of breath.   cyclobenzaprine (FLEXERIL) 5 MG tablet Take 1 tablet (5 mg total) by mouth 3 (three) times daily as needed for muscle spasms.   montelukast (SINGULAIR) 10 MG tablet Take 1 tablet (10 mg total) by mouth at bedtime.   naproxen (NAPROSYN) 500 MG tablet Take 1 po BID with food prn pain   predniSONE (DELTASONE) 50 MG tablet Take 1 tablet daily with food   No facility-administered encounter medications on file as of 08/17/2023.     Past Medical History:  Diagnosis Date   Headache    migraines/stress   Headache    Painful orthopaedic hardware ([facility]) 07/2014   right foot    Past Surgical History:  Procedure Laterality Date   BREAST CYST EXCISION Left    BREAST CYST EXCISION     FOOT SURGERY Right    Had screws and plate after fx, recently  removed plate and screws   HARDWARE REMOVAL Right 07/18/2014   Procedure: REMOVAL OF DEEP IMPLANTS FROM DORSAL, MEDIAL AND LATERAL RIGHT FOOT;  Surgeon: Toni Arthurs, MD;  Location: [facility];  Service: Orthopedics;  Laterality: Right;   MASS EXCISION Left 11/14/2013   Procedure: Excision left axillary mass ;  Surgeon: Almond Lint, MD;  Location: [facility];  Service: General;  Laterality: Left;   ORIF FOOT FRACTURE Right     No family history on file.  Social History   Social History Narrative   ** Merged History Encounter **         Review of Systems General: Denies fevers, chills, weight loss CV: Denies chest pain, shortness of breath, palpitations Breast: Large breasts which the patient feels is contributing to her upper back and neck pain.  She does note that the right side is significantly heavier than the left side.  She did have a breast biopsy which was benign.  This was an 66.  Physical Exam    08/17/2023    8:12 AM 12/12/2017   12:01 AM 12/11/2017   10:08 PM  Vitals with BMI  Height 5\' 7"   5\' 8"   Weight 193 lbs  200 lbs  BMI 30.22  30.42  Systolic 136 107 191  Diastolic 91 59 74  Pulse  78 66 88    General:  No acute distress,  Alert and oriented, Non-Toxic, Normal speech and affect Breast: Patient has large pendulous breast bilaterally.  She has grade 2 ptosis bilaterally.  The right side is significantly heavier than the left side.  There are no dominant masses on physical exam and the nipples are normal in appearance without evidence of nipple discharge.  There is a well-healed scar on the anterior portion of the left breast.  The sternal notch to nipple distance on the right is 34 cm and 35 cm on the left the fold and nipple distance on the right is 17 cm and 16 cm on the left Mammogram: 2023 BI-RADS 1.  Patient understands she will need mammogram prior to surgery. Assessment/Plan Symptomatic macromastia: Patient has significant  asymmetry with a very heavy right breast.  She would benefit from a bilateral breast reduction.  I can remove 900 g on the right and 600 to 700 g on the left.  We discussed breast reductions at length including the location of the incisions and the unpredictable nature of scarring and wound healing.  We discussed the risks of bleeding, infection, and seroma formation.  We discussed the risk of nipple loss due to nipple ischemia.  We discussed the use of drains postoperatively.  She understands that she will have a supportive compressive garment for 6 weeks.  We discussed the postoperative limitations including no heavy lifting greater than 20 pounds, no vigorous activity, and no submerging the incisions in water for 6 weeks.  We discussed the importance of early ambulation to prevent DVT as well as the fact that she can return to light activity as soon as she feels comfortable.  All questions were answered to her satisfaction.  Photographs were obtained today with her consent.  Will submit her for bilateral breast reduction at her request. She will need a mammogram prior to the procedure.  We will also submit a surgical clearance to her primary care physician.  This will need to be for cardiac clearance since she was evaluated for chest pain last year as well as clearance for the etiology of her left arm paresthesia.  A request for physical therapy due to her insurance requirements has also been submitted.  Santiago Glad 08/17/2023, 8:39 AM

## 2023-08-19 ENCOUNTER — Telehealth: Payer: Self-pay

## 2023-08-19 ENCOUNTER — Ambulatory Visit
Admission: RE | Admit: 2023-08-19 | Discharge: 2023-08-19 | Disposition: A | Discharge status: Home / Self Care | Source: Ambulatory Visit | Attending: Plastic Surgery | Admitting: Plastic Surgery

## 2023-08-19 DIAGNOSIS — N62 Hypertrophy of breast: Secondary | ICD-10-CM

## 2023-08-19 DIAGNOSIS — Z1231 Encounter for screening mammogram for malignant neoplasm of breast: Secondary | ICD-10-CM

## 2023-08-19 NOTE — Telephone Encounter (Signed)
 Faxed Request for Surgical Clearance Forms to Dr. Nehemiah Settle, PCP, at [phone] (phone number: [phone]) and Dr. Maple Hudson, Pulmonary, at [phone] (phone number: [phone]) both with confirmed receipt.

## 2023-08-22 ENCOUNTER — Encounter: Payer: Self-pay | Admitting: Physical Therapy

## 2023-08-22 ENCOUNTER — Ambulatory Visit: Attending: Plastic Surgery | Admitting: Physical Therapy

## 2023-08-22 ENCOUNTER — Other Ambulatory Visit: Payer: Self-pay

## 2023-08-22 DIAGNOSIS — M25519 Pain in unspecified shoulder: Secondary | ICD-10-CM | POA: Insufficient documentation

## 2023-08-22 DIAGNOSIS — M542 Cervicalgia: Secondary | ICD-10-CM | POA: Insufficient documentation

## 2023-08-22 DIAGNOSIS — N62 Hypertrophy of breast: Secondary | ICD-10-CM | POA: Insufficient documentation

## 2023-08-22 DIAGNOSIS — M6281 Muscle weakness (generalized): Secondary | ICD-10-CM | POA: Insufficient documentation

## 2023-08-22 DIAGNOSIS — G8929 Other chronic pain: Secondary | ICD-10-CM | POA: Insufficient documentation

## 2023-08-22 DIAGNOSIS — M546 Pain in thoracic spine: Secondary | ICD-10-CM | POA: Insufficient documentation

## 2023-08-22 DIAGNOSIS — R293 Abnormal posture: Secondary | ICD-10-CM | POA: Insufficient documentation

## 2023-08-22 NOTE — Patient Instructions (Signed)
 Access Code: [email or web address] URL: [email or web address] Date: 08/22/2023 Prepared by: Rosana Hoes  Exercises - Supine Cervical Retraction with Towel  - 1-2 x daily - 10 reps - 5 seconds hold - Supine Shoulder Horizontal Abduction with Resistance  - 1-2 x daily - 2 sets - 10 reps - Sidelying Thoracic Rotation with Open Book  - 1-2 x daily - 10 reps - 5 seconds hold - Standing Row with Anchored Resistance  - 1-2 x daily - 2 sets - 10 reps

## 2023-08-22 NOTE — Therapy (Signed)
 OUTPATIENT PHYSICAL THERAPY EVALUATION   Patient Name: Kaitlyn Valentine MRN: [id number] DOB:1981-05-14, 43 y.o., female Today's Date: 08/22/2023   END OF SESSION:  PT End of Session - 08/22/23 1548     Visit Number 1    Number of Visits 6    Date for PT Re-Evaluation 10/17/23    Authorization Type Aet[employer]    PT Start Time 1530    PT Stop Time 1615    PT Time Calculation (min) 45 min    Activity Tolerance Patient tolerated treatment well    Behavior During Therapy [facility] for tasks assessed/performed             Past Medical History:  Diagnosis Date   Headache    migraines/stress   Headache    Painful orthopaedic hardware (HCC) 07/2014   right foot   Past Surgical History:  Procedure Laterality Date   BREAST CYST EXCISION Left    BREAST CYST EXCISION     FOOT SURGERY Right    Had screws and plate after fx, recently removed plate and screws   HARDWARE REMOVAL Right 07/18/2014   Procedure: REMOVAL OF DEEP IMPLANTS FROM DORSAL, MEDIAL AND LATERAL RIGHT FOOT;  Surgeon: Toni Arthurs, MD;  Location: [facility];  Service: Orthopedics;  Laterality: Right;   MASS EXCISION Left 11/14/2013   Procedure: Excision left axillary mass ;  Surgeon: Almond Lint, MD;  Location: [facility];  Service: General;  Laterality: Left;   ORIF FOOT FRACTURE Right    Patient Active Problem List   Diagnosis Date Noted   Axillary mass, left 09/12/2013    PCP: None  REFERRING PROVIDER: Santiago Glad, MD  REFERRING DIAG: Macromastia; Neck and shoulder pain; Chronic bilateral thoracic back pain  THERAPY DIAG:  Cervicalgia  Pain in thoracic spine  Muscle weakness (generalized)  Abnormal posture  Rationale for Evaluation and Treatment: Rehabilitation  ONSET DATE: Chronic   SUBJECTIVE:             SUBJECTIVE STATEMENT: Patient reports pain in upper back and neck region. She rolls out her shoulders when it gets stiff and that seems to help. She feels  pressure from her bra straps but that a lot of her pain occurs when she takes her bra off and it feels really heavy. She feels like she is not able to sit with a good posture. Patient also reports frequent headaches.  Hand dominance: Right  PERTINENT HISTORY:  See PMH above  PAIN:  Are you having pain? Yes:  NPRS scale: 5-6/10 Pain location: Upper back, neck and shoulders Pain description: Tense, aching Aggravating factors: Sitting extended periods, bra strap pressure Relieving factors: Rolling shoulders, medication  PRECAUTIONS: None  RED FLAGS: None   WEIGHT BEARING RESTRICTIONS: No  FALLS:  Has patient fallen in last 6 months? No  PLOF: Independent  PATIENT GOALS: Pain relief   OBJECTIVE:  Note: Objective measures were completed at Evaluation unless otherwise noted. PATIENT SURVEYS:  NDI 24/50 (48%)  COGNITION: Overall cognitive status: Within functional limits for tasks assessed  SENSATION: WFL  POSTURE:   Rounded shoulder and forward head posture  PALPATION: Tender to palpation bilateral upper traps and cervical paraspinals   CERVICAL ROM:   Active ROM A/PROM (deg) eval  Flexion 60  Extension 40  Right lateral flexion 20  Left lateral flexion 20  Right rotation 30  Left rotation 60   (Blank rows = not tested)  UPPER EXTREMITY ROM:   UE ROM  grossly WFL  UPPER EXTREMITY MMT:  MMT Right eval Left eval  Shoulder flexion    Shoulder extension    Shoulder abduction    Shoulder adduction    Shoulder extension    Shoulder internal rotation    Shoulder external rotation    Middle trapezius 3 3  Lower trapezius 3 3  Elbow flexion    Elbow extension    Wrist flexion    Wrist extension    Wrist ulnar deviation    Wrist radial deviation    Wrist pronation    Wrist supination    Grip strength     (Blank rows = not tested)  FUNCTIONAL TESTS:  DNF endurance: 5 seconds   TREATMENT [facility] Adult PT Treatment:                                                 DATE: 08/22/2023 Suboccipital release with gentle manual traction Supine cervical retraction with peanut tennis balls Supine horizontal abduction with yellow Sidelying thoracic rotation Row with yellow  PATIENT EDUCATION:  Education details: Exam findings, POC, HEP Person educated: Patient Education method: Explanation, Demonstration, Tactile cues, Verbal cues, and Handouts Education comprehension: verbalized understanding, returned demonstration, verbal cues required, tactile cues required, and needs further education  HOME EXERCISE PROGRAM: Access Code: [email or web address]    ASSESSMENT: CLINICAL IMPRESSION: Patient is a 43 y.o. female who was seen today for physical therapy evaluation and treatment for chronic upper back and neck pain that seems postural related. She exhibits limitations with cervical and thoracic motion with increased muscle tension of the neck and upper trap region, periscapular strength deficit and postural deviations that are likely contributing to her pain and impacting her functional ability.   OBJECTIVE IMPAIRMENTS: decreased activity tolerance, decreased ROM, decreased strength, postural dysfunction, and pain.   ACTIVITY LIMITATIONS: carrying, lifting, bending, sitting, reach over head, and hygiene/grooming  PARTICIPATION LIMITATIONS: meal prep, cleaning, driving, shopping, and community activity  PERSONAL FACTORS: Fitness, Past/current experiences, and Time since onset of injury/illness/exacerbation are also affecting patient's functional outcome.   REHAB POTENTIAL: Good  CLINICAL DECISION MAKING: Stable/uncomplicated  EVALUATION COMPLEXITY: Low   GOALS: Goals reviewed with patient? Yes  SHORT TERM GOALS: =LTG  LONG TERM GOALS: Target date: 10/17/2023  Patient will be I with final HEP to maintain progress from PT. Baseline: HEP provided at eval Goal status: INITIAL  2.  Patient will report NDI </= 10/50 (20%) in order to indicate an  improvement in her functional status Baseline: 24/50 (48%) Goal status: INITIAL  3.  Patient will demonstrate right cervical rotation >/= 60 deg to improve driving and reduce cervical tension Baseline: 30 deg Goal status: INITIAL  4.  Patient will demonstrate DNF endurance >/= 20 sec in order to improve postural control and reduce neck pain Baseline: 5 seconds Goal status: INITIAL  5. Patient will report neck and upper back pain </= 2/10 in order to reduce functional limitation  Baseline: 5-6/10 pain  Goal status: INITIAL  6. Patient will demonstrate periscapular muscle strength >/= 4/5 MMT in order to improve postural control with sitting  Baseline: 3/5 MMT  Goal status: INITIAL   PLAN: PT FREQUENCY: 1x/week  PT DURATION: 8 weeks  PLANNED INTERVENTIONS: 97164- PT Re-evaluation, 97110-Therapeutic exercises, 97530- Therapeutic activity, [id number]- Neuromuscular re-education, 97535- Self Care, 96045- Manual therapy, [id number]- Electrical stimulation (unattended), [id number]- Electrical stimulation (  manual), Patient/Family education, Taping, Dry Needling, Joint mobilization, Joint manipulation, Spinal manipulation, Spinal mobilization, Cryotherapy, and Moist heat  PLAN FOR NEXT SESSION: Review HEP and progress PRN, manual/TPDN for upper trap and cervical region, cervical and thoracic mobility, progress postural strength and control   Rosana Hoes, PT, DPT, LAT, ATC 08/22/23  4:51 PM Phone: [phone] Fax: [phone]

## 2023-08-25 ENCOUNTER — Ambulatory Visit: Admitting: Plastic Surgery

## 2023-08-25 ENCOUNTER — Telehealth: Payer: Self-pay

## 2023-08-25 DIAGNOSIS — M25519 Pain in unspecified shoulder: Secondary | ICD-10-CM

## 2023-08-25 DIAGNOSIS — N6489 Other specified disorders of breast: Secondary | ICD-10-CM | POA: Diagnosis not present

## 2023-08-25 DIAGNOSIS — N62 Hypertrophy of breast: Secondary | ICD-10-CM | POA: Diagnosis not present

## 2023-08-25 NOTE — Telephone Encounter (Signed)
 Received fax from Dr. Nehemiah Settle, patient is cleared from a PCP standpoint for surgery.

## 2023-08-25 NOTE — Progress Notes (Signed)
 Ms. Kaitlyn Valentine returns today for reevaluation.  She was recently denied by her insurance company for her breast reduction.  I believe I can easily increase the weight on the right breast to 1000 g.  Due to her significant asymmetry she understands to achieve 1000 g on the left she may be left with a very small breast.  We had a long discussion regarding these goals and while I do not agree with the insurance company decision she feels that she is so uncomfortable she is willing to accept a breast that is smaller than proportionate for her body size.  Symptomatic macromastia: Discussed at length with the patient.  Will resubmit for 1000 g per breast.

## 2023-08-26 ENCOUNTER — Encounter: Payer: Self-pay | Admitting: Plastic Surgery

## 2023-08-31 ENCOUNTER — Encounter: Payer: Self-pay | Admitting: Physical Therapy

## 2023-08-31 ENCOUNTER — Ambulatory Visit: Attending: Plastic Surgery | Admitting: Physical Therapy

## 2023-08-31 DIAGNOSIS — M6281 Muscle weakness (generalized): Secondary | ICD-10-CM | POA: Insufficient documentation

## 2023-08-31 DIAGNOSIS — R293 Abnormal posture: Secondary | ICD-10-CM | POA: Diagnosis present

## 2023-08-31 DIAGNOSIS — M542 Cervicalgia: Secondary | ICD-10-CM | POA: Insufficient documentation

## 2023-08-31 DIAGNOSIS — M546 Pain in thoracic spine: Secondary | ICD-10-CM | POA: Insufficient documentation

## 2023-08-31 NOTE — Therapy (Signed)
 OUTPATIENT PHYSICAL THERAPY DAILY NOTE   Patient Name: Kaitlyn Valentine MRN: [id number] DOB:[birth year], 43 y.o., female Today's Date: 08/31/2023   END OF SESSION:  PT End of Session - 08/31/23 0831     Visit Number 2    Number of Visits 6    Date for PT Re-Evaluation 10/17/23    Authorization Type Aet[employer]    PT Start Time 0830    PT Stop Time 0911    PT Time Calculation (min) 41 min    Activity Tolerance Patient tolerated treatment well    Behavior During Therapy [facility] for tasks assessed/performed             Past Medical History:  Diagnosis Date   Headache    migraines/stress   Headache    Painful orthopaedic hardware (HCC) 07/2014   right foot   Past Surgical History:  Procedure Laterality Date   BREAST CYST EXCISION Left    BREAST CYST EXCISION     FOOT SURGERY Right    Had screws and plate after fx, recently removed plate and screws   HARDWARE REMOVAL Right 07/18/2014   Procedure: REMOVAL OF DEEP IMPLANTS FROM DORSAL, MEDIAL AND LATERAL RIGHT FOOT;  Surgeon: Kaitlyn Arthurs, MD;  Location: [facility];  Service: Orthopedics;  Laterality: Right;   MASS EXCISION Left 11/14/2013   Procedure: Excision left axillary mass ;  Surgeon: Kaitlyn Lint, MD;  Location: [facility];  Service: General;  Laterality: Left;   ORIF FOOT FRACTURE Right    Patient Active Problem List   Diagnosis Date Noted   Axillary mass, left 09/12/2013    PCP: None  REFERRING PROVIDER: Santiago Glad, MD  REFERRING DIAG: Macromastia; Neck and shoulder pain; Chronic bilateral thoracic back pain  THERAPY DIAG:  Cervicalgia  Pain in thoracic spine  Muscle weakness (generalized)  Abnormal posture  Rationale for Evaluation and Treatment: Rehabilitation  ONSET DATE: Chronic   SUBJECTIVE:             SUBJECTIVE STATEMENT: Pt reports that her neck and shoulder continue to hurt.  She rates her pain a 5/10   Hand dominance: Right  PERTINENT HISTORY:   See PMH above  PAIN:  Are you having pain? Yes:  NPRS scale: 5-6/10 Pain location: Upper back, neck and shoulders Pain description: Tense, aching Aggravating factors: Sitting extended periods, bra strap pressure Relieving factors: Rolling shoulders, medication  PRECAUTIONS: None  RED FLAGS: None   WEIGHT BEARING RESTRICTIONS: No  FALLS:  Has patient fallen in last 6 months? No  PLOF: Independent  PATIENT GOALS: Pain relief   OBJECTIVE:  Note: Objective measures were completed at Evaluation unless otherwise noted. PATIENT SURVEYS:  NDI 24/50 (48%)  COGNITION: Overall cognitive status: Within functional limits for tasks assessed  SENSATION: WFL  POSTURE:   Rounded shoulder and forward head posture  PALPATION: Tender to palpation bilateral upper traps and cervical paraspinals   CERVICAL ROM:   Active ROM A/PROM (deg) eval  Flexion 60  Extension 40  Right lateral flexion 20  Left lateral flexion 20  Right rotation 30  Left rotation 60   (Blank rows = not tested)  UPPER EXTREMITY ROM:   UE ROM grossly WFL  UPPER EXTREMITY MMT:  MMT Right eval Left eval  Shoulder flexion    Shoulder extension    Shoulder abduction    Shoulder adduction    Shoulder extension    Shoulder internal rotation    Shoulder external rotation  Middle trapezius 3 3  Lower trapezius 3 3  Elbow flexion    Elbow extension    Wrist flexion    Wrist extension    Wrist ulnar deviation    Wrist radial deviation    Wrist pronation    Wrist supination    Grip strength     (Blank rows = not tested)  FUNCTIONAL TESTS:  DNF endurance: 5 seconds   TREATMENT [facility] Adult PT Treatment:                                                DATE: 08/31/23 UBE 3'/3' fwd and backward for warm up while taking subjective Foam roller routine for thoracic mobility - including protraction/retraction (unilateral and bilateral), cc/cw circles, horizontal abduction Supine horizontal abduction  with YTB - 2x15 Sidelying thoracic rotation - small range - 10x ea Corner pec stretch - 45'' x2 Row with Blue TB - 2x10  Manual Therapy Suboccipital release with gentle manual traction STM R UT  PATIENT EDUCATION:  Education details: Exam findings, POC, HEP Person educated: Patient Education method: Explanation, Demonstration, Tactile cues, Verbal cues, and Handouts Education comprehension: verbalized understanding, returned demonstration, verbal cues required, tactile cues required, and needs further education  HOME EXERCISE PROGRAM: Access Code: [email or web address]    ASSESSMENT: CLINICAL IMPRESSION: Kaitlyn Valentine tolerated session fair with no adverse reaction.  Concentrated on gentle thoracic mobility and periscapular strengthening.  Pt limited by pain with open book so modified to small range.  Pt with reported reduction in sxs following manual therapy.   OBJECTIVE IMPAIRMENTS: decreased activity tolerance, decreased ROM, decreased strength, postural dysfunction, and pain.   ACTIVITY LIMITATIONS: carrying, lifting, bending, sitting, reach over head, and hygiene/grooming  PARTICIPATION LIMITATIONS: meal prep, cleaning, driving, shopping, and community activity  PERSONAL FACTORS: Fitness, Past/current experiences, and Time since onset of injury/illness/exacerbation are also affecting patient's functional outcome.   REHAB POTENTIAL: Good  CLINICAL DECISION MAKING: Stable/uncomplicated  EVALUATION COMPLEXITY: Low   GOALS: Goals reviewed with patient? Yes  SHORT TERM GOALS: =LTG  LONG TERM GOALS: Target date: 10/17/2023  Patient will be I with final HEP to maintain progress from PT. Baseline: HEP provided at eval Goal status: INITIAL  2.  Patient will report NDI </= 10/50 (20%) in order to indicate an improvement in her functional status Baseline: 24/50 (48%) Goal status: INITIAL  3.  Patient will demonstrate right cervical rotation >/= 60 deg to improve driving and reduce  cervical tension Baseline: 30 deg Goal status: INITIAL  4.  Patient will demonstrate DNF endurance >/= 20 sec in order to improve postural control and reduce neck pain Baseline: 5 seconds Goal status: INITIAL  5. Patient will report neck and upper back pain </= 2/10 in order to reduce functional limitation  Baseline: 5-6/10 pain  Goal status: INITIAL  6. Patient will demonstrate periscapular muscle strength >/= 4/5 MMT in order to improve postural control with sitting  Baseline: 3/5 MMT  Goal status: INITIAL   PLAN: PT FREQUENCY: 1x/week  PT DURATION: 8 weeks  PLANNED INTERVENTIONS: 97164- PT Re-evaluation, 97110-Therapeutic exercises, 97530- Therapeutic activity, 97112- Neuromuscular re-education, 97535- Self Care, 21308- Manual therapy, [id number]- Electrical stimulation (unattended), [phone]- Electrical stimulation (manual), Patient/Family education, Taping, Dry Needling, Joint mobilization, Joint manipulation, Spinal manipulation, Spinal mobilization, Cryotherapy, and Moist heat  PLAN FOR NEXT SESSION: Review HEP and progress PRN, manual/TPDN for upper  trap and cervical region, cervical and thoracic mobility, progress postural strength and control   Fredderick Phenix PT 08/31/23  9:14 AM Phone: [phone] Fax: [phone]

## 2023-09-07 ENCOUNTER — Ambulatory Visit

## 2023-09-07 DIAGNOSIS — M542 Cervicalgia: Secondary | ICD-10-CM

## 2023-09-07 DIAGNOSIS — M6281 Muscle weakness (generalized): Secondary | ICD-10-CM

## 2023-09-07 DIAGNOSIS — M546 Pain in thoracic spine: Secondary | ICD-10-CM

## 2023-09-07 DIAGNOSIS — R293 Abnormal posture: Secondary | ICD-10-CM

## 2023-09-07 NOTE — Therapy (Signed)
 OUTPATIENT PHYSICAL THERAPY DAILY NOTE   Patient Name: Kaitlyn Valentine MRN: [id number] DOB:August 06, 1980, 43 y.o., female Today's Date: 09/07/2023   END OF SESSION:  PT End of Session - 09/07/23 0835     Visit Number 3    Number of Visits 6    Date for PT Re-Evaluation 10/17/23    Authorization Type Aetna    PT Start Time 0831    PT Stop Time 0910    PT Time Calculation (min) 39 min    Activity Tolerance Patient tolerated treatment well    Behavior During Therapy [facility] for tasks assessed/performed              Past Medical History:  Diagnosis Date   Headache    migraines/stress   Headache    Painful orthopaedic hardware (HCC) 07/2014   right foot   Past Surgical History:  Procedure Laterality Date   BREAST CYST EXCISION Left    BREAST CYST EXCISION     FOOT SURGERY Right    Had screws and plate after fx, recently removed plate and screws   HARDWARE REMOVAL Right 07/18/2014   Procedure: REMOVAL OF DEEP IMPLANTS FROM DORSAL, MEDIAL AND LATERAL RIGHT FOOT;  Surgeon: Toni Arthurs, MD;  Location: [facility];  Service: Orthopedics;  Laterality: Right;   MASS EXCISION Left 11/14/2013   Procedure: Excision left axillary mass ;  Surgeon: Almond Lint, MD;  Location: [facility];  Service: General;  Laterality: Left;   ORIF FOOT FRACTURE Right    Patient Active Problem List   Diagnosis Date Noted   Axillary mass, left 09/12/2013    PCP: None  REFERRING PROVIDER: Santiago Glad, MD  REFERRING DIAG: Macromastia; Neck and shoulder pain; Chronic bilateral thoracic back pain  THERAPY DIAG:  Cervicalgia  Pain in thoracic spine  Abnormal posture  Muscle weakness (generalized)  Rationale for Evaluation and Treatment: Rehabilitation  ONSET DATE: Chronic   SUBJECTIVE:             SUBJECTIVE STATEMENT: Patient reports that she had soreness in her neck after last session, that went away after a couple of days.  Hand dominance:  Right  PERTINENT HISTORY:  See PMH above  PAIN:  Are you having pain? Yes:  NPRS scale: 5-6/10 Pain location: Upper back, neck and shoulders Pain description: Tense, aching Aggravating factors: Sitting extended periods, bra strap pressure Relieving factors: Rolling shoulders, medication  PRECAUTIONS: None  RED FLAGS: None   WEIGHT BEARING RESTRICTIONS: No  FALLS:  Has patient fallen in last 6 months? No  PLOF: Independent  PATIENT GOALS: Pain relief   OBJECTIVE:  Note: Objective measures were completed at Evaluation unless otherwise noted. PATIENT SURVEYS:  NDI 24/50 (48%)  COGNITION: Overall cognitive status: Within functional limits for tasks assessed  SENSATION: WFL  POSTURE:   Rounded shoulder and forward head posture  PALPATION: Tender to palpation bilateral upper traps and cervical paraspinals   CERVICAL ROM:   Active ROM A/PROM (deg) eval  Flexion 60  Extension 40  Right lateral flexion 20  Left lateral flexion 20  Right rotation 30  Left rotation 60   (Blank rows = not tested)  UPPER EXTREMITY ROM:   UE ROM grossly WFL  UPPER EXTREMITY MMT:  MMT Right eval Left eval  Shoulder flexion    Shoulder extension    Shoulder abduction    Shoulder adduction    Shoulder extension    Shoulder internal rotation    Shoulder  external rotation    Middle trapezius 3 3  Lower trapezius 3 3  Elbow flexion    Elbow extension    Wrist flexion    Wrist extension    Wrist ulnar deviation    Wrist radial deviation    Wrist pronation    Wrist supination    Grip strength     (Blank rows = not tested)  FUNCTIONAL TESTS:  DNF endurance: 5 seconds   TREATMENT  [facility] Adult PT Treatment:                                                DATE: 09/07/2023  UBE 2' fwd at lvl 2; 2' backward at level 1 for warm up while taking subjective Standing horizontal abduction with RTB - 2x15 Row with Blue TB - 2x10 Pull downs with Green TB - 2 x 10  Supine  cw/ccw circles x 20 each   Standing cat cow x 8  Sidelying thoracic rotation "open book" Unable to tolerate  Cervical retraction in supine  Reports onset of headache  Doorway pec stretch -  3 x 30 sec      PATIENT EDUCATION:  Education details: Exam findings, POC, HEP Person educated: Patient Education method: Explanation, Demonstration, Tactile cues, Verbal cues, and Handouts Education comprehension: verbalized understanding, returned demonstration, verbal cues required, tactile cues required, and needs further education  HOME EXERCISE PROGRAM: Access Code: [email or web address] URL: [email or web address] Date: 09/07/2023 Prepared by: Mauri Reading  Exercises - Supine Cervical Retraction with Towel  - 1-2 x daily - 10 reps - 5 seconds hold - Supine Shoulder Horizontal Abduction with Resistance  - 1-2 x daily - 2 sets - 10 reps - Standing Row with Anchored Resistance  - 1-2 x daily - 2 sets - 10 reps - Modified Cat Cow at Wall  - 1 x daily - 7 x weekly - 2 sets - 10 reps - Gentle Levator Scapulae Stretch  - 1 x daily - 7 x weekly - 2-3 reps - 30 sec hold   ASSESSMENT: CLINICAL IMPRESSION: Patient responds fairly well to today's interventions. She has some difficulty tolerating cervical retraction and sidelying TS extension d/t neck pain. She reports some relief of symptoms with mobility activities at end of session. We will continue to progress as tolerated.     OBJECTIVE IMPAIRMENTS: decreased activity tolerance, decreased ROM, decreased strength, postural dysfunction, and pain.   ACTIVITY LIMITATIONS: carrying, lifting, bending, sitting, reach over head, and hygiene/grooming  PARTICIPATION LIMITATIONS: meal prep, cleaning, driving, shopping, and community activity  PERSONAL FACTORS: Fitness, Past/current experiences, and Time since onset of injury/illness/exacerbation are also affecting patient's functional outcome.   REHAB POTENTIAL: Good  CLINICAL DECISION MAKING:  Stable/uncomplicated  EVALUATION COMPLEXITY: Low   GOALS: Goals reviewed with patient? Yes  SHORT TERM GOALS: =LTG  LONG TERM GOALS: Target date: 10/17/2023  Patient will be I with final HEP to maintain progress from PT. Baseline: HEP provided at eval Goal status: INITIAL  2.  Patient will report NDI </= 10/50 (20%) in order to indicate an improvement in her functional status Baseline: 24/50 (48%) Goal status: INITIAL  3.  Patient will demonstrate right cervical rotation >/= 60 deg to improve driving and reduce cervical tension Baseline: 30 deg Goal status: INITIAL  4.  Patient will demonstrate DNF endurance >/= 20 sec in order to improve postural  control and reduce neck pain Baseline: 5 seconds Goal status: INITIAL  5. Patient will report neck and upper back pain </= 2/10 in order to reduce functional limitation  Baseline: 5-6/10 pain  Goal status: INITIAL  6. Patient will demonstrate periscapular muscle strength >/= 4/5 MMT in order to improve postural control with sitting  Baseline: 3/5 MMT  Goal status: INITIAL   PLAN: PT FREQUENCY: 1x/week  PT DURATION: 8 weeks  PLANNED INTERVENTIONS: 97164- PT Re-evaluation, 97110-Therapeutic exercises, 97530- Therapeutic activity, 97112- Neuromuscular re-education, 97535- Self Care, 04540- Manual therapy, [id number]- Electrical stimulation (unattended), [phone]- Electrical stimulation (manual), Patient/Family education, Taping, Dry Needling, Joint mobilization, Joint manipulation, Spinal manipulation, Spinal mobilization, Cryotherapy, and Moist heat  PLAN FOR NEXT SESSION: Review HEP and progress PRN, manual/TPDN for upper trap and cervical region, cervical and thoracic mobility, progress postural strength and control   Mauri Reading, PT, DPT  09/07/2023 9:17 AM

## 2023-09-14 ENCOUNTER — Ambulatory Visit

## 2023-09-14 DIAGNOSIS — R293 Abnormal posture: Secondary | ICD-10-CM

## 2023-09-14 DIAGNOSIS — M542 Cervicalgia: Secondary | ICD-10-CM | POA: Diagnosis not present

## 2023-09-14 DIAGNOSIS — M546 Pain in thoracic spine: Secondary | ICD-10-CM

## 2023-09-14 NOTE — Therapy (Signed)
 OUTPATIENT PHYSICAL THERAPY DAILY NOTE   Patient Name: Kaitlyn Valentine MRN: [id number] DOB:1981/05/08, 43 y.o., female Today's Date: 09/14/2023   END OF SESSION:  PT End of Session - 09/14/23 0836     Visit Number 4    Number of Visits 6    Date for PT Re-Evaluation 10/17/23    Authorization Type [employer]    PT Start Time [phone]    PT Stop Time 0910    PT Time Calculation (min) 38 min    Activity Tolerance Patient tolerated treatment well    Behavior During Therapy [facility] for tasks assessed/performed               Past Medical History:  Diagnosis Date   Headache    migraines/stress   Headache    Painful orthopaedic hardware (HCC) 07/2014   right foot   Past Surgical History:  Procedure Laterality Date   BREAST CYST EXCISION Left    BREAST CYST EXCISION     FOOT SURGERY Right    Had screws and plate after fx, recently removed plate and screws   HARDWARE REMOVAL Right 07/18/2014   Procedure: REMOVAL OF DEEP IMPLANTS FROM DORSAL, MEDIAL AND LATERAL RIGHT FOOT;  Surgeon: Toni Arthurs, MD;  Location: [facility];  Service: Orthopedics;  Laterality: Right;   MASS EXCISION Left 11/14/2013   Procedure: Excision left axillary mass ;  Surgeon: Almond Lint, MD;  Location: [facility];  Service: General;  Laterality: Left;   ORIF FOOT FRACTURE Right    Patient Active Problem List   Diagnosis Date Noted   Axillary mass, left 09/12/2013    PCP: None  REFERRING PROVIDER: Santiago Glad, MD  REFERRING DIAG: Macromastia; Neck and shoulder pain; Chronic bilateral thoracic back pain  THERAPY DIAG:  Cervicalgia  Pain in thoracic spine  Abnormal posture  Rationale for Evaluation and Treatment: Rehabilitation  ONSET DATE: Chronic   SUBJECTIVE:             SUBJECTIVE STATEMENT:  Patient reports that she had soreness and pain her neck following last visit, which lasted all day.   Hand dominance: Right  PERTINENT HISTORY:  See PMH  above  PAIN:  Are you having pain? Yes:  NPRS scale: 5-6/10 Pain location: Upper back, neck and shoulders Pain description: Tense, aching Aggravating factors: Sitting extended periods, bra strap pressure Relieving factors: Rolling shoulders, medication  PRECAUTIONS: None  RED FLAGS: None   WEIGHT BEARING RESTRICTIONS: No  FALLS:  Has patient fallen in last 6 months? No  PLOF: Independent  PATIENT GOALS: Pain relief   OBJECTIVE:  Note: Objective measures were completed at Evaluation unless otherwise noted. PATIENT SURVEYS:  NDI 24/50 (48%)  COGNITION: Overall cognitive status: Within functional limits for tasks assessed  SENSATION: WFL  POSTURE:   Rounded shoulder and forward head posture  PALPATION: Tender to palpation bilateral upper traps and cervical paraspinals   CERVICAL ROM:   Active ROM A/PROM (deg) eval  Flexion 60  Extension 40  Right lateral flexion 20  Left lateral flexion 20  Right rotation 30  Left rotation 60   (Blank rows = not tested)  UPPER EXTREMITY ROM:   UE ROM grossly WFL  UPPER EXTREMITY MMT:  MMT Right eval Left eval  Shoulder flexion    Shoulder extension    Shoulder abduction    Shoulder adduction    Shoulder extension    Shoulder internal rotation    Shoulder external rotation  Middle trapezius 3 3  Lower trapezius 3 3  Elbow flexion    Elbow extension    Wrist flexion    Wrist extension    Wrist ulnar deviation    Wrist radial deviation    Wrist pronation    Wrist supination    Grip strength     (Blank rows = not tested)  FUNCTIONAL TESTS:  DNF endurance: 5 seconds   TREATMENT  [facility] Adult PT Treatment:                                                DATE: 09/07/2023  UBE 1', 2 min fwd/2 min backward  Pball rolling on wall x 10  Standing TS rotation "open book", 2 x 5 each side  Standing horizontal abduction with RTB - 2x10, back against wall  Standing diagonal abduction with TB, 2 x5 each side   Row with Green TB - 2x10 Pull downs with Green TB - 2 x 10  Standing cat cow x 8  Doorway pec stretch -  3 x 30 sec  MHP to thoracic spine in supine, concurrent with following exercises Shoulder circles cw/ccw Shoulder flexion/extension alternating   PATIENT EDUCATION:  Education details: Exam findings, POC, HEP Person educated: Patient Education method: Explanation, Demonstration, Tactile cues, Verbal cues, and Handouts Education comprehension: verbalized understanding, returned demonstration, verbal cues required, tactile cues required, and needs further education  HOME EXERCISE PROGRAM: Access Code: [email or web address] URL: [email or web address] Date: 09/07/2023 Prepared by: Arlester Bence  Exercises - Supine Cervical Retraction with Towel  - 1-2 x daily - 10 reps - 5 seconds hold - Supine Shoulder Horizontal Abduction with Resistance  - 1-2 x daily - 2 sets - 10 reps - Standing Row with Anchored Resistance  - 1-2 x daily - 2 sets - 10 reps - Modified Cat Cow at Wall  - 1 x daily - 7 x weekly - 2 sets - 10 reps - Gentle Levator Scapulae Stretch  - 1 x daily - 7 x weekly - 2-3 reps - 30 sec hold   ASSESSMENT: CLINICAL IMPRESSION: 09/14/2023 Patient had improved tolerance of thoracic rotation stretch today with changing position from sidelying to standing. Her pain is decreased following mobility exercises and use of moist heat pack at end of session. Encouraged patient to work on HEP over the next week to maximize rehab outcomes. We will continue to progress as tolerated.     OBJECTIVE IMPAIRMENTS: decreased activity tolerance, decreased ROM, decreased strength, postural dysfunction, and pain.   ACTIVITY LIMITATIONS: carrying, lifting, bending, sitting, reach over head, and hygiene/grooming  PARTICIPATION LIMITATIONS: meal prep, cleaning, driving, shopping, and community activity  PERSONAL FACTORS: Fitness, Past/current experiences, and Time since onset of  injury/illness/exacerbation are also affecting patient's functional outcome.   REHAB POTENTIAL: Good  CLINICAL DECISION MAKING: Stable/uncomplicated  EVALUATION COMPLEXITY: Low   GOALS: Goals reviewed with patient? Yes  SHORT TERM GOALS: =LTG  LONG TERM GOALS: Target date: 10/17/2023  Patient will be I with final HEP to maintain progress from PT. Baseline: HEP provided at eval Goal status: INITIAL  2.  Patient will report NDI </= 10/50 (20%) in order to indicate an improvement in her functional status Baseline: 24/50 (48%) Goal status: INITIAL  3.  Patient will demonstrate right cervical rotation >/= 60 deg to improve driving and reduce cervical tension Baseline: 30 deg  Goal status: INITIAL  4.  Patient will demonstrate DNF endurance >/= 20 sec in order to improve postural control and reduce neck pain Baseline: 5 seconds Goal status: INITIAL  5. Patient will report neck and upper back pain </= 2/10 in order to reduce functional limitation  Baseline: 5-6/10 pain  Goal status: INITIAL  6. Patient will demonstrate periscapular muscle strength >/= 4/5 MMT in order to improve postural control with sitting  Baseline: 3/5 MMT  Goal status: INITIAL   PLAN: PT FREQUENCY: 1x/week  PT DURATION: 8 weeks  PLANNED INTERVENTIONS: 97164- PT Re-evaluation, 97110-Therapeutic exercises, 97530- Therapeutic activity, 97112- Neuromuscular re-education, 97535- Self Care, 21308- Manual therapy, [id number]- Electrical stimulation (unattended), [phone]- Electrical stimulation (manual), Patient/Family education, Taping, Dry Needling, Joint mobilization, Joint manipulation, Spinal manipulation, Spinal mobilization, Cryotherapy, and Moist heat  PLAN FOR NEXT SESSION: Review HEP and progress PRN, manual/TPDN for upper trap and cervical region, cervical and thoracic mobility, progress postural strength and control   Arlester Bence, PT, DPT  09/14/2023 9:27 AM

## 2023-09-21 ENCOUNTER — Ambulatory Visit

## 2023-09-21 DIAGNOSIS — M6281 Muscle weakness (generalized): Secondary | ICD-10-CM

## 2023-09-21 DIAGNOSIS — M546 Pain in thoracic spine: Secondary | ICD-10-CM

## 2023-09-21 DIAGNOSIS — M542 Cervicalgia: Secondary | ICD-10-CM | POA: Diagnosis not present

## 2023-09-21 DIAGNOSIS — R293 Abnormal posture: Secondary | ICD-10-CM

## 2023-09-21 NOTE — Therapy (Signed)
 OUTPATIENT PHYSICAL THERAPY DAILY NOTE   Patient Name: Kaitlyn Valentine MRN: [id number] DOB:07-12-1980, 43 y.o., female Today's Date: 09/21/2023   END OF SESSION:  PT End of Session - 09/21/23 0845     Visit Number 5    Number of Visits 6    Date for PT Re-Evaluation 10/17/23    Authorization Type [employer]et[employer]    PT Start Time [phone]    PT Stop Time 0913    PT Time Calculation (min) 35 min    Activity Tolerance Patient tolerated treatment well    Behavior During Therapy [facility] for tasks assessed/performed                Past Medical History:  Diagnosis Date   Headache    migraines/stress   Headache    Painful orthopaedic hardware (HCC) 07/2014   right foot   Past Surgical History:  Procedure Laterality Date   BREAST CYST EXCISION Left    BREAST CYST EXCISION     FOOT SURGERY Right    Had screws and plate after fx, recently removed plate and screws   HARDWARE REMOVAL Right 07/18/2014   Procedure: REMOVAL OF DEEP IMPLANTS FROM DORSAL, MEDIAL AND LATERAL RIGHT FOOT;  Surgeon: Amada Backer, MD;  Location: [facility];  Service: Orthopedics;  Laterality: Right;   MASS EXCISION Left 11/14/2013   Procedure: Excision left axillary mass ;  Surgeon: Lockie Rima, MD;  Location: [facility];  Service: General;  Laterality: Left;   ORIF FOOT FRACTURE Right    Patient Active Problem List   Diagnosis Date Noted   Axillary mass, left 09/12/2013    PCP: None  REFERRING PROVIDER: Teretha Ferguson, MD  REFERRING DIAG: Macromastia; Neck and shoulder pain; Chronic bilateral thoracic back pain  THERAPY DIAG:  Cervicalgia  Pain in thoracic spine  Abnormal posture  Muscle weakness (generalized)  Rationale for Evaluation and Treatment: Rehabilitation  ONSET DATE: Chronic   SUBJECTIVE:             SUBJECTIVE STATEMENT:  Patient reporting ongoing soreness in her upper back, especially at end of work day.   Hand dominance: Right  PERTINENT  HISTORY:  See PMH above  PAIN:  Are you having pain? Yes:  NPRS scale: 5-6/10 Pain location: Upper back, neck and shoulders Pain description: Tense, aching Aggravating factors: Sitting extended periods, bra strap pressure Relieving factors: Rolling shoulders, medication  PRECAUTIONS: None  RED FLAGS: None   WEIGHT BEARING RESTRICTIONS: No  FALLS:  Has patient fallen in last 6 months? No  PLOF: Independent  PATIENT GOALS: Pain relief   OBJECTIVE:  Note: Objective measures were completed at Evaluation unless otherwise noted. PATIENT SURVEYS:  NDI 24/50 (48%)  COGNITION: Overall cognitive status: Within functional limits for tasks assessed  SENSATION: WFL  POSTURE:   Rounded shoulder and forward head posture  PALPATION: Tender to palpation bilateral upper traps and cervical paraspinals   CERVICAL ROM:   Active ROM A/PROM (deg) eval  Flexion 60  Extension 40  Right lateral flexion 20  Left lateral flexion 20  Right rotation 30  Left rotation 60   (Blank rows = not tested)  UPPER EXTREMITY ROM:   UE ROM grossly WFL  UPPER EXTREMITY MMT:  MMT Right eval Left eval  Shoulder flexion    Shoulder extension    Shoulder abduction    Shoulder adduction    Shoulder extension    Shoulder internal rotation    Shoulder external rotation  Middle trapezius 3 3  Lower trapezius 3 3  Elbow flexion    Elbow extension    Wrist flexion    Wrist extension    Wrist ulnar deviation    Wrist radial deviation    Wrist pronation    Wrist supination    Grip strength     (Blank rows = not tested)  FUNCTIONAL TESTS:  DNF endurance: 5 seconds   TREATMENT  [facility] Adult PT Treatment:                                                DATE: 09/21/2023  UBE 1', 2 min fwd/2 min backward  Pball rolling on wall x 10  Standing TS rotation "open book", x 10 each side  Standing horizontal abduction with green TB - 2x10, back against wall  Standing diagonal abduction with  green TB, 2 x5 each side  Row with Green TB - 2x10 Pull downs with Green TB - 2 x 10  Standing cat cow x 10  Doorway pec stretch -  3 x 30 sec    PATIENT EDUCATION:  Education details: Exam findings, POC, HEP Person educated: Patient Education method: Explanation, Demonstration, Tactile cues, Verbal cues, and Handouts Education comprehension: verbalized understanding, returned demonstration, verbal cues required, tactile cues required, and needs further education  HOME EXERCISE PROGRAM: Access Code: [email or web address] URL: [email or web address] Date: 09/07/2023 Prepared by: Arlester Bence  Exercises - Supine Cervical Retraction with Towel  - 1-2 x daily - 10 reps - 5 seconds hold - Supine Shoulder Horizontal Abduction with Resistance  - 1-2 x daily - 2 sets - 10 reps - Standing Row with Anchored Resistance  - 1-2 x daily - 2 sets - 10 reps - Modified Cat Cow at Wall  - 1 x daily - 7 x weekly - 2 sets - 10 reps - Gentle Levator Scapulae Stretch  - 1 x daily - 7 x weekly - 2-3 reps - 30 sec hold   ASSESSMENT: CLINICAL IMPRESSION: 09/21/2023 Kaitlyn Valentine had good tolerance of today's treatment session, which focused on periscapular strengthening and TS mobility.  Plan is to continue with updates to HEP and reassess objective measures in preparation for discharge from skilled PT.      OBJECTIVE IMPAIRMENTS: decreased activity tolerance, decreased ROM, decreased strength, postural dysfunction, and pain.   ACTIVITY LIMITATIONS: carrying, lifting, bending, sitting, reach over head, and hygiene/grooming  PARTICIPATION LIMITATIONS: meal prep, cleaning, driving, shopping, and community activity  PERSONAL FACTORS: Fitness, Past/current experiences, and Time since onset of injury/illness/exacerbation are also affecting patient's functional outcome.   REHAB POTENTIAL: Good  CLINICAL DECISION MAKING: Stable/uncomplicated  EVALUATION COMPLEXITY: Low   GOALS: Goals reviewed with  patient? Yes  SHORT TERM GOALS: =LTG  LONG TERM GOALS: Target date: 10/17/2023  Patient will be I with final HEP to maintain progress from PT. Baseline: HEP provided at eval Goal status: INITIAL  2.  Patient will report NDI </= 10/50 (20%) in order to indicate an improvement in her functional status Baseline: 24/50 (48%) Goal status: INITIAL  3.  Patient will demonstrate right cervical rotation >/= 60 deg to improve driving and reduce cervical tension Baseline: 30 deg Goal status: INITIAL  4.  Patient will demonstrate DNF endurance >/= 20 sec in order to improve postural control and reduce neck pain Baseline: 5 seconds Goal status: INITIAL  5.  Patient will report neck and upper back pain </= 2/10 in order to reduce functional limitation  Baseline: 5-6/10 pain  Goal status: INITIAL  6. Patient will demonstrate periscapular muscle strength >/= 4/5 MMT in order to improve postural control with sitting  Baseline: 3/5 MMT  Goal status: INITIAL   PLAN: PT FREQUENCY: 1x/week  PT DURATION: 8 weeks  PLANNED INTERVENTIONS: 97164- PT Re-evaluation, 97110-Therapeutic exercises, 97530- Therapeutic activity, 97112- Neuromuscular re-education, 97535- Self Care, 60454- Manual therapy, G0283- Electrical stimulation (unattended), [phone]- Electrical stimulation (manual), Patient/Family education, Taping, Dry Needling, Joint mobilization, Joint manipulation, Spinal manipulation, Spinal mobilization, Cryotherapy, and Moist heat  PLAN FOR NEXT SESSION: Review HEP and progress PRN, manual/TPDN for upper trap and cervical region, cervical and thoracic mobility, progress postural strength and control   Arlester Bence, PT, DPT  09/21/2023 9:14 AM

## 2023-09-28 ENCOUNTER — Ambulatory Visit: Admitting: Physical Therapy

## 2023-09-28 ENCOUNTER — Encounter: Payer: Self-pay | Admitting: Physical Therapy

## 2023-09-28 DIAGNOSIS — M6281 Muscle weakness (generalized): Secondary | ICD-10-CM

## 2023-09-28 DIAGNOSIS — M542 Cervicalgia: Secondary | ICD-10-CM

## 2023-09-28 DIAGNOSIS — R293 Abnormal posture: Secondary | ICD-10-CM

## 2023-09-28 DIAGNOSIS — M546 Pain in thoracic spine: Secondary | ICD-10-CM

## 2023-09-28 NOTE — Therapy (Signed)
 PHYSICAL THERAPY DISCHARGE SUMMARY  Visits from Start of Care: 6  Current functional level related to goals / functional outcomes: See assessment/goals   Remaining deficits: See assessment/goals   Education / Equipment: HEP and D/C plans  Patient agrees to discharge. Patient goals were not met. Patient is being discharged due to lack of progress.   Patient Name: Kaitlyn Valentine MRN: [id number] DOB:Oct 17, 1980, 43 y.o., female Today's Date: 09/28/2023   END OF SESSION:  PT End of Session - 09/28/23 0802     Visit Number 6    Number of Visits 6    Date for PT Re-Evaluation 10/17/23    Authorization Type [employer]et[employer]    PT Start Time 0801    PT Stop Time 0842    PT Time Calculation (min) 41 min    Activity Tolerance Patient tolerated treatment well    Behavior During Therapy [facility] for tasks assessed/performed                Past Medical History:  Diagnosis Date   Headache    migraines/stress   Headache    Painful orthopaedic hardware (HCC) 07/2014   right foot   Past Surgical History:  Procedure Laterality Date   BREAST CYST EXCISION Left    BREAST CYST EXCISION     FOOT SURGERY Right    Had screws and plate after fx, recently removed plate and screws   HARDWARE REMOVAL Right 07/18/2014   Procedure: REMOVAL OF DEEP IMPLANTS FROM DORSAL, MEDIAL AND LATERAL RIGHT FOOT;  Surgeon: Amada Backer, MD;  Location: [facility];  Service: Orthopedics;  Laterality: Right;   MASS EXCISION Left 11/14/2013   Procedure: Excision left axillary mass ;  Surgeon: Lockie Rima, MD;  [facility];  Service: General;  Laterality: Left;   ORIF FOOT FRACTURE Right    Patient Active Problem List   Diagnosis Date Noted   Axillary mass, left 09/12/2013    PCP: None  REFERRING PROVIDER: Teretha Ferguson, MD  REFERRING DIAG: Macromastia; Neck and shoulder pain; Chronic bilateral thoracic back pain  THERAPY DIAG:  Cervicalgia  Pain in  thoracic spine  Abnormal posture  Muscle weakness (generalized)  Rationale for Evaluation and Treatment: Rehabilitation  ONSET DATE: Chronic   SUBJECTIVE:             SUBJECTIVE STATEMENT:  Pt reports that her pain is about the same, especially after therapy.   Hand dominance: Right  PERTINENT HISTORY:  See PMH above  PAIN:  Are you having pain? Yes:  NPRS scale: 5-6/10 Pain location: Upper back, neck and shoulders Pain description: Tense, aching Aggravating factors: Sitting extended periods, bra strap pressure Relieving factors: Rolling shoulders, medication  PRECAUTIONS: None  RED FLAGS: None   WEIGHT BEARING RESTRICTIONS: No  FALLS:  Has patient fallen in last 6 months? No  PLOF: Independent  PATIENT GOALS: Pain relief   OBJECTIVE:  Note: Objective measures were completed at Evaluation unless otherwise noted. PATIENT SURVEYS:  NDI 24/50 (48%)  COGNITION: Overall cognitive status: Within functional limits for tasks assessed  SENSATION: WFL  POSTURE:   Rounded shoulder and forward head posture  PALPATION: Tender to palpation bilateral upper traps and cervical paraspinals   CERVICAL ROM:   Active ROM A/PROM (deg) eval  Flexion 60  Extension 40  Right lateral flexion 20  Left lateral flexion 20  Right rotation 30  Left rotation 60   (Blank rows = not tested)  UPPER EXTREMITY ROM:  UE ROM grossly WFL  UPPER EXTREMITY MMT:  MMT Right eval Left eval R/L 4/30  Shoulder flexion     Shoulder extension     Shoulder abduction     Shoulder adduction     Shoulder extension     Shoulder internal rotation     Shoulder external rotation     Middle trapezius 3 3 3+/3+  Lower trapezius 3 3 3/3  Elbow flexion     Elbow extension     Wrist flexion     Wrist extension     Wrist ulnar deviation     Wrist radial deviation     Wrist pronation     Wrist supination     Grip strength      (Blank rows = not tested)  FUNCTIONAL TESTS:  DNF  endurance: 5 seconds   TREATMENT  [facility] Adult PT Treatment:                                                DATE: 09/28/2023  Therex:  UBE 1', 2 min fwd/2 min backward  Pball rolling on wall x 10  Standing TS rotation "open book", x 10 each side  Standing horizontal abduction with green TB - 2x10, back against wall  Standing diagonal abduction with green TB, 2 x5 each side  Row with Green TB - 2x10 Pull downs with Green TB - 2 x 10  Standing cat cow x 10  Doorway pec stretch -  3 x 30 sec  Chop - 7# - 2x10 ea  Therapeutic Activity  collecting information for goals, checking progress, and reviewing with patient   PATIENT EDUCATION:  Education details: Exam findings, POC, HEP Person educated: Patient Education method: Explanation, Demonstration, Tactile cues, Verbal cues, and Handouts Education comprehension: verbalized understanding, returned demonstration, verbal cues required, tactile cues required, and needs further education  HOME EXERCISE PROGRAM: Access Code: [email or web address] URL: [email or web address] Date: 09/07/2023 Prepared by: Arlester Bence  Exercises - Supine Cervical Retraction with Towel  - 1-2 x daily - 10 reps - 5 seconds hold - Supine Shoulder Horizontal Abduction with Resistance  - 1-2 x daily - 2 sets - 10 reps - Standing Row with Anchored Resistance  - 1-2 x daily - 2 sets - 10 reps - Modified Cat Cow at Wall  - 1 x daily - 7 x weekly - 2 sets - 10 reps - Gentle Levator Scapulae Stretch  - 1 x daily - 7 x weekly - 2-3 reps - 30 sec hold   ASSESSMENT: CLINICAL IMPRESSION: 09/28/2023 Upon goal recheck pt has made progress with cervical ROM, NDI score, and has had ~ 2/10 reduction in her mid and low back pain.  Unfortunately, she is still having significant limitations with daily activities d/t pain and has made minimal progress with strength and DNF endurance testing.  We will D/C today with HEP; pt in agreement.     OBJECTIVE IMPAIRMENTS:  decreased activity tolerance, decreased ROM, decreased strength, postural dysfunction, and pain.   ACTIVITY LIMITATIONS: carrying, lifting, bending, sitting, reach over head, and hygiene/grooming  PARTICIPATION LIMITATIONS: meal prep, cleaning, driving, shopping, and community activity  PERSONAL FACTORS: Fitness, Past/current experiences, and Time since onset of injury/illness/exacerbation are also affecting patient's functional outcome.   REHAB POTENTIAL: Good  CLINICAL DECISION MAKING: Stable/uncomplicated  EVALUATION COMPLEXITY: Low   GOALS: Goals reviewed  with patient? Yes  SHORT TERM GOALS: =LTG  LONG TERM GOALS: Target date: 10/17/2023  Patient will be I with final HEP to maintain progress from PT. Baseline: HEP provided at eval 4/30: MET Goal status: MET  2.  Patient will report NDI </= 10/50 (20%) in order to indicate an improvement in her functional status Baseline: 24/50 (48%) 4/30: 12/50 (24%) Goal status: Partially met  3.  Patient will demonstrate right cervical rotation >/= 60 deg to improve driving and reduce cervical tension Baseline: 30 deg 4/30: 62 deg Goal status: MET  4.  Patient will demonstrate DNF endurance >/= 20 sec in order to improve postural control and reduce neck pain Baseline: 5 seconds 4/30: 5 seconds Goal status: NOT MET  5. Patient will report neck and upper back pain </= 2/10 in order to reduce functional limitation  Baseline: 5-6/10 pain 4/30: 3-5/10  Goal status: NOT MET  6. Patient will demonstrate periscapular muscle strength >/= 4/5 MMT in order to improve postural control with sitting  Baseline: 3/5 MMT 4/30: not met  Goal status: INITIAL   PLAN: PT FREQUENCY: 1x/week  PT DURATION: 8 weeks  PLANNED INTERVENTIONS: 97164- PT Re-evaluation, 97110-Therapeutic exercises, 97530- Therapeutic activity, 97112- Neuromuscular re-education, 97535- Self Care, 16109- Manual therapy, [id number]- Electrical stimulation (unattended), [phone]-  Electrical stimulation (manual), Patient/Family education, Taping, Dry Needling, Joint mobilization, Joint manipulation, Spinal manipulation, Spinal mobilization, Cryotherapy, and Moist heat  PLAN FOR NEXT SESSION: Review HEP and progress PRN, manual/TPDN for upper trap and cervical region, cervical and thoracic mobility, progress postural strength and control   Donovan Gallant Nyaisha Simao PT 09/28/2023 8:41 AM

## 2023-10-05 ENCOUNTER — Ambulatory Visit: Admitting: Physical Therapy

## 2023-10-19 ENCOUNTER — Ambulatory Visit: Admitting: Surgical

## 2023-11-22 NOTE — Progress Notes (Unsigned)
 Patient ID: Kaitlyn Valentine, female    DOB: 1980/09/18, 43 y.o.   MRN: [id number]  Chief Complaint  Patient presents with   Pre-op Exam      ICD-10-CM   1. Macromastia  N62     2. Neck and shoulder pain  M54.2    M25.519     3. Breast asymmetry  N64.89     4. Chronic bilateral thoracic back pain  M54.6    G89.29       History of Present Illness: Kaitlyn Valentine is a 43 y.o.  female  with a history of macromastia.  She presents for preoperative evaluation for upcoming procedure, Bilateral Breast Reduction, scheduled for 12/06/23 with Dr.  Waddell  The patient {HAS HAS [id number]:[id number]} had problems with anesthesia. ***  Summary of Previous Visit: STN 34 cm right, 35 cm left.  Nipple to fold on the right is 17 cm and 16 cm on the left.  Estimated excess breast tissue to be removed at time of surgery: 1000 grams  Job: ***  PMH Significant for: ***  Patient had mammogram 08/19/2023, BI-RADS 1.  Negative.  Naproxen ,  Past Medical History: Allergies: No Known Allergies  Current Medications:  Current Outpatient Medications:    albuterol  (PROVENTIL  HFA;VENTOLIN  HFA) 108 (90 Base) MCG/ACT inhaler, Inhale 1-2 puffs into the lungs every 6 (six) hours as needed for wheezing or shortness of breath., Disp: 1 Inhaler, Rfl: 2   cyclobenzaprine  (FLEXERIL ) 5 MG tablet, Take 1 tablet (5 mg total) by mouth 3 (three) times daily as needed for muscle spasms., Disp: 20 tablet, Rfl: 0   montelukast  (SINGULAIR ) 10 MG tablet, Take 1 tablet (10 mg total) by mouth at bedtime., Disp: 30 tablet, Rfl: 2   naproxen  (NAPROSYN ) 500 MG tablet, Take 1 po BID with food prn pain, Disp: 30 tablet, Rfl: 0   predniSONE  (DELTASONE ) 50 MG tablet, Take 1 tablet daily with food, Disp: 5 tablet, Rfl: 0  Past Medical Problems: Past Medical History:  Diagnosis Date   Headache    migraines/stress   Headache    Painful orthopaedic hardware (HCC) 07/2014   right foot    Past Surgical  History: Past Surgical History:  Procedure Laterality Date   BREAST CYST EXCISION Left    BREAST CYST EXCISION     FOOT SURGERY Right    Had screws and plate after fx, recently removed plate and screws   HARDWARE REMOVAL Right 07/18/2014   Procedure: REMOVAL OF DEEP IMPLANTS FROM DORSAL, MEDIAL AND LATERAL RIGHT FOOT;  Surgeon: Norleen Armor, MD;  Location: [facility];  Service: Orthopedics;  Laterality: Right;   MASS EXCISION Left 11/14/2013   Procedure: Excision left axillary mass ;  Surgeon: Jina Nephew, MD;  Location: [facility];  Service: General;  Laterality: Left;   ORIF FOOT FRACTURE Right     Social History: Social History   Socioeconomic History   Marital status: Married    Spouse name: Not on file   Number of children: Not on file   Years of education: Not on file   Highest education level: Not on file  Occupational History   Not on file  Tobacco Use   Smoking status: Never   Smokeless tobacco: Never  Substance and Sexual Activity   Alcohol use: No   Drug use: No   Sexual activity: Yes  Other Topics Concern   Not on file  Social History Narrative   ** Merged  History Encounter **       Social Drivers of Corporate investment banker Strain: Not on file  Food Insecurity: Not on file  Transportation Needs: Not on file  Physical Activity: Not on file  Stress: Not on file  Social Connections: Not on file  Intimate Partner Violence: Not on file    Family History: No family history on file.  Review of Systems: ROS  Physical Exam: Vital Signs There were no vitals taken for this visit.  Physical Exam *** Constitutional:      General: Not in acute distress.    Appearance: Normal appearance. Not ill-appearing.  HENT:     Head: Normocephalic and atraumatic.  Eyes:     Pupils: Pupils are equal, round Neck:     Musculoskeletal: Normal range of motion.  Cardiovascular:     Rate and Rhythm: Normal rate    Pulses: Normal  pulses.  Pulmonary:     Effort: Pulmonary effort is normal. No respiratory distress.  Musculoskeletal: Normal range of motion.  Skin:    General: Skin is warm and dry.     Findings: No erythema or rash.  Neurological:     General: No focal deficit present.     Mental Status: Alert and oriented to person, place, and time. Mental status is at baseline.     Motor: No weakness.  Psychiatric:        Mood and Affect: Mood normal.        Behavior: Behavior normal.    Assessment/Plan: The patient is scheduled for bilateral breast reduction with Dr. {Aojwx:[id number]::Ujbonm,Ipoopwhyjf}.  Risks, benefits, and alternatives of procedure discussed, questions answered and consent obtained.    Smoking Status: ***; Counseling Given? *** Last Mammogram: ***; Results: ***  Caprini Score: ***; Risk Factors include: ***, BMI *** 25, and length of planned surgery. Recommendation for mechanical *** pharmacological prophylaxis. Encourage early ambulation.   Pictures obtained: @consult ***  Post-op Rx sent to pharmacy: {Blank:[id number]::Oxycodone , Zofran , Keflex,Oxycodone , Zofran }  Patient was provided with the breast reduction and General Surgical Risk consent document and Pain Medication Agreement prior to their appointment.  They had adequate time to read through the risk consent documents and Pain Medication Agreement. We also discussed them in person together during this preop appointment. All of their questions were answered to their satisfaction.  Recommended calling if they have any further questions.  Risk consent form and Pain Medication Agreement to be scanned into patient's chart.  The risk that can be encountered with breast reduction were discussed and include the following but not limited to these:  Breast asymmetry, fluid accumulation, firmness of the breast, inability to breast feed, loss of nipple or areola, skin loss, decrease or no nipple sensation, fat necrosis of the breast tissue,  bleeding, infection, healing delay.  There are risks of anesthesia, changes to skin sensation and injury to nerves or blood vessels.  The muscle can be temporarily or permanently injured.  You may have an allergic reaction to tape, suture, glue, blood products which can result in skin discoloration, swelling, pain, skin lesions, poor healing.  Any of these can lead to the need for revisonal surgery or stage procedures.  A reduction has potential to interfere with diagnostic procedures.  Nipple or breast piercing can increase risks of infection.  This procedure is best done when the breast is fully developed.  Changes in the breast will continue to occur over time.  Pregnancy can alter the outcomes of previous breast reduction surgery, weight gain  and weigh loss can also effect the long term appearance.     Electronically signed by: Donnice PARAS Brynna Dobos, PA-C 11/22/2023 3:39 PM

## 2023-11-22 NOTE — H&P (View-Only) (Signed)
 No show

## 2023-11-23 ENCOUNTER — Ambulatory Visit (INDEPENDENT_AMBULATORY_CARE_PROVIDER_SITE_OTHER): Admitting: Surgical

## 2023-11-23 ENCOUNTER — Encounter: Payer: Self-pay | Admitting: Surgical

## 2023-11-23 VITALS — BP 122/82 | HR 84 | Ht 67.0 in | Wt 190.8 lb

## 2023-11-23 DIAGNOSIS — G8929 Other chronic pain: Secondary | ICD-10-CM

## 2023-11-23 DIAGNOSIS — M542 Cervicalgia: Secondary | ICD-10-CM

## 2023-11-23 DIAGNOSIS — M25519 Pain in unspecified shoulder: Secondary | ICD-10-CM

## 2023-11-23 DIAGNOSIS — N6489 Other specified disorders of breast: Secondary | ICD-10-CM

## 2023-11-23 DIAGNOSIS — N62 Hypertrophy of breast: Secondary | ICD-10-CM

## 2023-11-23 DIAGNOSIS — M546 Pain in thoracic spine: Secondary | ICD-10-CM

## 2023-11-23 MED ORDER — OXYCODONE HCL 5 MG PO TABS: 5.0000 mg | ORAL_TABLET | Freq: Four times a day (QID) | ORAL | 0 refills | Status: AC | PRN

## 2023-11-23 MED ORDER — ONDANSETRON HCL 4 MG PO TABS: 4.0000 mg | ORAL_TABLET | Freq: Three times a day (TID) | ORAL | 0 refills | Status: AC | PRN

## 2023-11-23 NOTE — Progress Notes (Addendum)
 Patient ID: Kaitlyn Valentine, female    DOB: 05-24-1981, 43 y.o.   MRN: [id number]  Chief Complaint  Patient presents with   Pre-op Exam      ICD-10-CM   1. Macromastia  N62     2. Neck and shoulder pain  M54.2    M25.519     3. Breast asymmetry  N64.89     4. Chronic bilateral thoracic back pain  M54.6    G89.29       History of Present Illness: Kaitlyn Valentine is a 43 y.o.  female  with a history of macromastia.  She presents for preoperative evaluation for upcoming procedure, Bilateral Breast Reduction, scheduled for 12/06/23 with Dr.  Waddell  The patient has not had problems with anesthesia. No history of DVT/PE.  No family history of DVT/PE.  No family or personal history of bleeding or clotting disorders.  Patient is not currently taking any blood thinners.  No history of CVA/MI.   Summary of Previous Visit: STN 34 cm right, 35 cm left.  Nipple to fold on the right is 17 cm and 16 cm on the left.  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  PMH Significant for: Asthma, reports worse during the spring/pollen season.  Reports has been much better recently.  She does report that she was recently prescribed Ozempic, but has only had 1 dose 1 week ago.  She is going to hold it between now and surgery.  Patient had mammogram 08/19/2023, BI-RADS 1.  Negative.  PCP clearance was sent on day of consult with Dr. Waddell, clearance received.  Patient medically cleared for surgery.  Patient reports she is feeling well lately, no recent changes to her health in the last month.  She does report that she hurt her left knee at work a few months ago, but seems to be improving.  She does not have any cardiac or pulmonary symptoms today.  Past Medical History: Allergies: No Known Allergies  Current Medications:  Outpatient  Encounter Medications as of 11/23/2023  Medication Sig   albuterol  (VENTOLIN  HFA) 108 (90 Base) MCG/ACT inhaler Inhale 2 puffs every six (6) hours as needed for wheezing or shortness of breath.   cyclobenzaprine  (FLEXERIL ) 5 MG tablet Take 1 tablet (5 mg total) by mouth 3 (three) times daily as needed for muscle spasms.   montelukast  (SINGULAIR ) 10 MG tablet Take 1 tablet (10 mg total) by mouth at bedtime.   naproxen  (NAPROSYN ) 500 MG tablet Take 1 po BID with food prn pain   predniSONE  (DELTASONE ) 50 MG tablet Take 1 tablet daily with food   [DISCONTINUED] albuterol  (PROVENTIL  HFA;VENTOLIN  HFA) 108 (90 Base) MCG/ACT inhaler Inhale 1-2 puffs into the lungs every 6 (six) hours as needed for wheezing or shortness of breath.   No facility-administered encounter medications on file as of 11/23/2023.   Past Medical History:  Diagnosis Date   Headache    migraines/stress   Headache    Painful orthopaedic hardware [facility]) 07/2014   right foot    Past Surgical History: Past Surgical History:  Procedure Laterality Date   BREAST CYST EXCISION Left    BREAST CYST EXCISION     FOOT SURGERY Right    Had screws and plate after fx, recently removed plate and screws   HARDWARE REMOVAL  Right 07/18/2014   Procedure: REMOVAL OF DEEP IMPLANTS FROM DORSAL, MEDIAL AND LATERAL RIGHT FOOT;  Surgeon: Norleen Armor, MD;  Location: [facility];  Service: Orthopedics;  Laterality: Right;   MASS EXCISION Left 11/14/2013   Procedure: Excision left axillary mass ;  Surgeon: Jina Nephew, MD;  Location:  SUR[facility]ERY C[facility];  Service: General;  Laterality: Left;   ORIF FOOT FRACTURE Right     Social History: Social History   Socioeconomic History   Marital status: Married    Spouse name: Not on file   Number of children: Not on file   Years of education: Not on file   Highest education level: Not on file  Occupational History   Not on file  Tobacco Use   Smoking status: Never   Smokeless  tobacco: Never  Substance and Sexual Activity   Alcohol use: No   Drug use: No   Sexual activity: Yes  Other Topics Concern   Not on file  Social History Narrative   ** Merged History Encounter **       Social Drivers of Corporate investment banker Strain: Not on file  Food Insecurity: Not on file  Transportation Needs: Not on file  Physical Activity: Not on file  Stress: Not on file  Social Connections: Not on file  Intimate Partner Violence: Not on file    Family History: No family history on file.  Review of Systems: Review of Systems  Constitutional: Negative.   Respiratory: Negative.    Cardiovascular: Negative.   Gastrointestinal: Negative.   Neurological: Negative.     Physical Exam: Vital Signs BP 122/82 (BP Location: Left Arm, Patient Position: Sitting, Cuff Size: Large)   Pulse 84   Ht 5' 7 (1.702 m)   Wt 190 lb 12.8 oz (86.5 kg)   SpO2 98%   BMI 29.88 kg/m   Physical Exam  Constitutional:      General: Not in acute distress.    Appearance: Normal appearance. Not ill-appearing.  HENT:     Head: Normocephalic and atraumatic.  Eyes:     Pupils: Pupils are equal, round Neck:     Musculoskeletal: Normal range of motion.  Cardiovascular:     Rate and Rhythm: Normal rate    Pulses: Normal pulses.  Pulmonary:     Effort: Pulmonary effort is normal. No respiratory distress.  Musculoskeletal: Normal range of motion.  Skin:    General: Skin is warm and dry.     Findings: No erythema or rash.  Neurological:     General: No focal deficit present.     Mental Status: Alert and oriented to person, place, and time. Mental status is at baseline.     Motor: No weakness.  Psychiatric:        Mood and Affect: Mood normal.        Behavior: Behavior normal.    Assessment/Plan: The patient is scheduled for bilateral breast reduction with Dr. Waddell.  Risks, benefits, and alternatives of procedure discussed, questions answered and consent obtained.     Smoking Status: Non-smoker; Counseling Given?  N/A Last Mammogram: 08/19/2023, BI-RADS 1, negative.  Caprini Score: 4, moderate; Risk Factors include: Age, BMI > 25, and length of planned surgery. Recommendation for mechanical prophylaxis. Encourage early ambulation.   Pictures obtained: @consult   Post-op Rx sent to pharmacy: Oxycodone , Zofran   Patient was provided with the breast reduction and General Surgical Risk consent document and Pain Medication Agreement prior to their appointment.  They  had adequate time to read through the risk consent documents and Pain Medication Agreement. We also discussed them in person together during this preop appointment. All of their questions were answered to their satisfaction.  Recommended calling if they have any further questions.  Risk consent form and Pain Medication Agreement to be scanned into patient's chart.  The risk that can be encountered with breast reduction were discussed and include the following but not limited to these:  Breast asymmetry, fluid accumulation, firmness of the breast, inability to breast feed, loss of nipple or areola, skin loss, decrease or no nipple sensation, fat necrosis of the breast tissue, bleeding, infection, healing delay.  There are risks of anesthesia, changes to skin sensation and injury to nerves or blood vessels.  The muscle can be temporarily or permanently injured.  You may have an allergic reaction to tape, suture, glue, blood products which can result in skin discoloration, swelling, pain, skin lesions, poor healing.  Any of these can lead to the need for revisonal surgery or stage procedures.  A reduction has potential to interfere with diagnostic procedures.  Nipple or breast piercing can increase risks of infection.  This procedure is best done when the breast is fully developed.  Changes in the breast will continue to occur over time.  Pregnancy can alter the outcomes of previous breast reduction surgery,  weight gain and weigh loss can also effect the long term appearance.    Electronically signed by: Kaitlyn PARAS Darin Arndt, PA-C 11/22/2023 3:39 PM

## 2023-11-25 ENCOUNTER — Encounter (HOSPITAL_BASED_OUTPATIENT_CLINIC_OR_DEPARTMENT_OTHER): Payer: Self-pay | Admitting: Plastic Surgery

## 2023-11-25 ENCOUNTER — Other Ambulatory Visit: Payer: Self-pay

## 2023-12-01 NOTE — Anesthesia Preprocedure Evaluation (Addendum)
 Anesthesia Evaluation  Patient identified by MRN, date of birth, ID band Patient awake    Reviewed: Allergy & Precautions, NPO status , Patient's Chart, lab work & pertinent test results  History of Anesthesia Complications Negative for: history of anesthetic complications  Airway Mallampati: II  TM Distance: >3 FB Neck ROM: Full    Dental  (+) Dental Advisory Given,    Pulmonary neg shortness of breath, asthma , neg sleep apnea, neg COPD, neg recent URI   Pulmonary exam normal breath sounds clear to auscultation       Cardiovascular negative cardio ROS  Rhythm:Regular Rate:Normal     Neuro/Psych  Headaches, neg Seizures  negative psych ROS   GI/Hepatic negative GI ROS, Neg liver ROS,,,  Endo/Other  negative endocrine ROS    Renal/GU negative Renal ROS  negative genitourinary   Musculoskeletal negative musculoskeletal ROS (+)    Abdominal  (+) + obese  Peds  Hematology negative hematology ROS (+)   Anesthesia Other Findings   Reproductive/Obstetrics                              Anesthesia Physical Anesthesia Plan  ASA: 2  Anesthesia Plan: General   Post-op Pain Management: Tylenol  PO (pre-op)*, Ketamine  IV*, Dilaudid  IV and Precedex    Induction: Intravenous  PONV Risk Score and Plan: 3 and Ondansetron , Dexamethasone , Midazolam , Treatment may vary due to age or medical condition and Scopolamine  patch - Pre-op  Airway Management Planned: Oral ETT  Additional Equipment: None  Intra-op Plan:   Post-operative Plan: Extubation in OR  Informed Consent: I have reviewed the patients History and Physical, chart, labs and discussed the procedure including the risks, benefits and alternatives for the proposed anesthesia with the patient or authorized representative who has indicated his/her understanding and acceptance.     Dental advisory given  Plan Discussed with: CRNA and  Anesthesiologist  Anesthesia Plan Comments: (Risks of general anesthesia discussed including, but not limited to, sore throat, hoarse voice, chipped/damaged teeth, injury to vocal cords, nausea and vomiting, allergic reactions, lung infection, heart attack, stroke, and death. All questions answered. )         Anesthesia Quick Evaluation

## 2023-12-06 ENCOUNTER — Other Ambulatory Visit: Payer: Self-pay

## 2023-12-06 ENCOUNTER — Ambulatory Visit (HOSPITAL_BASED_OUTPATIENT_CLINIC_OR_DEPARTMENT_OTHER): Payer: Self-pay | Admitting: Anesthesiology

## 2023-12-06 ENCOUNTER — Encounter (HOSPITAL_BASED_OUTPATIENT_CLINIC_OR_DEPARTMENT_OTHER): Payer: Self-pay | Admitting: Plastic Surgery

## 2023-12-06 ENCOUNTER — Encounter (HOSPITAL_BASED_OUTPATIENT_CLINIC_OR_DEPARTMENT_OTHER)
Admission: RE | Disposition: A | Discharge status: Home / Self Care | Payer: Self-pay | Source: Home / Self Care | Attending: Plastic Surgery

## 2023-12-06 ENCOUNTER — Ambulatory Visit (HOSPITAL_BASED_OUTPATIENT_CLINIC_OR_DEPARTMENT_OTHER)
Admission: RE | Admit: 2023-12-06 | Discharge: 2023-12-06 | Disposition: A | Discharge status: Home / Self Care | Attending: Plastic Surgery | Admitting: Plastic Surgery

## 2023-12-06 DIAGNOSIS — R519 Headache, unspecified: Secondary | ICD-10-CM | POA: Insufficient documentation

## 2023-12-06 DIAGNOSIS — N6002 Solitary cyst of left breast: Secondary | ICD-10-CM | POA: Insufficient documentation

## 2023-12-06 DIAGNOSIS — N62 Hypertrophy of breast: Secondary | ICD-10-CM

## 2023-12-06 DIAGNOSIS — Z6829 Body mass index (BMI) 29.0-29.9, adult: Secondary | ICD-10-CM | POA: Diagnosis not present

## 2023-12-06 DIAGNOSIS — Z79899 Other long term (current) drug therapy: Secondary | ICD-10-CM | POA: Diagnosis not present

## 2023-12-06 DIAGNOSIS — J45909 Unspecified asthma, uncomplicated: Secondary | ICD-10-CM | POA: Insufficient documentation

## 2023-12-06 DIAGNOSIS — M542 Cervicalgia: Secondary | ICD-10-CM | POA: Insufficient documentation

## 2023-12-06 DIAGNOSIS — Z01818 Encounter for other preprocedural examination: Secondary | ICD-10-CM

## 2023-12-06 DIAGNOSIS — G8929 Other chronic pain: Secondary | ICD-10-CM | POA: Diagnosis not present

## 2023-12-06 DIAGNOSIS — N6489 Other specified disorders of breast: Secondary | ICD-10-CM | POA: Insufficient documentation

## 2023-12-06 DIAGNOSIS — N6022 Fibroadenosis of left breast: Secondary | ICD-10-CM | POA: Diagnosis not present

## 2023-12-06 DIAGNOSIS — Z7951 Long term (current) use of inhaled steroids: Secondary | ICD-10-CM | POA: Insufficient documentation

## 2023-12-06 DIAGNOSIS — M546 Pain in thoracic spine: Secondary | ICD-10-CM | POA: Insufficient documentation

## 2023-12-06 DIAGNOSIS — E669 Obesity, unspecified: Secondary | ICD-10-CM | POA: Insufficient documentation

## 2023-12-06 DIAGNOSIS — M25519 Pain in unspecified shoulder: Secondary | ICD-10-CM | POA: Diagnosis not present

## 2023-12-06 DIAGNOSIS — N6021 Fibroadenosis of right breast: Secondary | ICD-10-CM | POA: Diagnosis not present

## 2023-12-06 DIAGNOSIS — N6001 Solitary cyst of right breast: Secondary | ICD-10-CM | POA: Insufficient documentation

## 2023-12-06 HISTORY — DX: Unspecified asthma, uncomplicated: J45.909

## 2023-12-06 HISTORY — PX: BREAST REDUCTION SURGERY: SHX8

## 2023-12-06 LAB — POCT PREGNANCY, URINE: Preg Test, Ur: NEGATIVE

## 2023-12-06 SURGERY — MAMMOPLASTY, REDUCTION
Anesthesia: General | Site: Breast | Laterality: Bilateral

## 2023-12-06 MED ORDER — PROPOFOL 10 MG/ML IV BOLUS
INTRAVENOUS | Status: DC | PRN
  Administered 2023-12-06: 200 mg via INTRAVENOUS

## 2023-12-06 MED ORDER — CEFAZOLIN SODIUM-DEXTROSE 2-4 GM/100ML-% IV SOLN
INTRAVENOUS | Status: AC
  Filled 2023-12-06: qty 100

## 2023-12-06 MED ORDER — MIDAZOLAM HCL 2 MG/2ML IJ SOLN
INTRAMUSCULAR | Status: DC | PRN
  Administered 2023-12-06: 2 mg via INTRAVENOUS

## 2023-12-06 MED ORDER — HYDROMORPHONE HCL 1 MG/ML IJ SOLN
INTRAMUSCULAR | Status: AC
  Filled 2023-12-06: qty 0.5

## 2023-12-06 MED ORDER — MIDAZOLAM HCL 2 MG/2ML IJ SOLN
INTRAMUSCULAR | Status: AC
  Filled 2023-12-06: qty 2

## 2023-12-06 MED ORDER — OXYCODONE HCL 5 MG PO TABS: 5.0000 mg | ORAL_TABLET | Freq: Once | ORAL | Status: DC | PRN

## 2023-12-06 MED ORDER — SCOPOLAMINE 1 MG/3DAYS TD PT72
MEDICATED_PATCH | TRANSDERMAL | Status: AC
Start: 2023-12-06 — End: 2023-12-06
  Filled 2023-12-06: qty 1

## 2023-12-06 MED ORDER — MEPERIDINE HCL 25 MG/ML IJ SOLN: 6.2500 mg | INTRAMUSCULAR | Status: DC | PRN

## 2023-12-06 MED ORDER — PROPOFOL 10 MG/ML IV BOLUS
INTRAVENOUS | Status: AC
  Filled 2023-12-06: qty 20

## 2023-12-06 MED ORDER — SUGAMMADEX SODIUM 200 MG/2ML IV SOLN
INTRAVENOUS | Status: DC | PRN
  Administered 2023-12-06: 150 mg via INTRAVENOUS

## 2023-12-06 MED ORDER — ACETAMINOPHEN 500 MG PO TABS
1000.0000 mg | ORAL_TABLET | Freq: Once | ORAL | Status: AC
  Administered 2023-12-06: 1000 mg via ORAL

## 2023-12-06 MED ORDER — DEXAMETHASONE SODIUM PHOSPHATE 10 MG/ML IJ SOLN
INTRAMUSCULAR | Status: DC | PRN
  Administered 2023-12-06: 10 mg via INTRAVENOUS

## 2023-12-06 MED ORDER — KETAMINE HCL 10 MG/ML IJ SOLN
INTRAMUSCULAR | Status: DC | PRN
Start: 2023-12-06 — End: 2023-12-06
  Administered 2023-12-06: 30 mg via INTRAVENOUS

## 2023-12-06 MED ORDER — AMISULPRIDE (ANTIEMETIC) 5 MG/2ML IV SOLN
10.0000 mg | Freq: Once | INTRAVENOUS | Status: AC | PRN
  Administered 2023-12-06: 10 mg via INTRAVENOUS

## 2023-12-06 MED ORDER — SCOPOLAMINE 1 MG/3DAYS TD PT72
1.0000 | MEDICATED_PATCH | TRANSDERMAL | Status: DC
  Administered 2023-12-06: 1.5 mg via TRANSDERMAL

## 2023-12-06 MED ORDER — SUGAMMADEX SODIUM 200 MG/2ML IV SOLN: INTRAVENOUS | Status: DC | PRN

## 2023-12-06 MED ORDER — KETOROLAC TROMETHAMINE 30 MG/ML IJ SOLN: 30.0000 mg | Freq: Once | INTRAMUSCULAR | Status: DC | PRN

## 2023-12-06 MED ORDER — FENTANYL CITRATE (PF) 100 MCG/2ML IJ SOLN
INTRAMUSCULAR | Status: AC
  Filled 2023-12-06: qty 2

## 2023-12-06 MED ORDER — 0.9 % SODIUM CHLORIDE (POUR BTL) OPTIME
TOPICAL | Status: DC | PRN
  Administered 2023-12-06 (×3): 1000 mL

## 2023-12-06 MED ORDER — LACTATED RINGERS IV SOLN: INTRAVENOUS | Status: DC

## 2023-12-06 MED ORDER — LIDOCAINE 2% (20 MG/ML) 5 ML SYRINGE
INTRAMUSCULAR | Status: AC
  Filled 2023-12-06: qty 5

## 2023-12-06 MED ORDER — FENTANYL CITRATE (PF) 100 MCG/2ML IJ SOLN
INTRAMUSCULAR | Status: DC | PRN
  Administered 2023-12-06: 25 ug via INTRAVENOUS
  Administered 2023-12-06: 50 ug via INTRAVENOUS
  Administered 2023-12-06: 25 ug via INTRAVENOUS
  Administered 2023-12-06 (×2): 50 ug via INTRAVENOUS

## 2023-12-06 MED ORDER — ROCURONIUM BROMIDE 10 MG/ML (PF) SYRINGE
PREFILLED_SYRINGE | INTRAVENOUS | Status: AC
  Filled 2023-12-06: qty 10

## 2023-12-06 MED ORDER — PHENYLEPHRINE 80 MCG/ML (10ML) SYRINGE FOR IV PUSH (FOR BLOOD PRESSURE SUPPORT)
PREFILLED_SYRINGE | INTRAVENOUS | Status: DC | PRN
  Administered 2023-12-06 (×4): 80 ug via INTRAVENOUS

## 2023-12-06 MED ORDER — OXYCODONE HCL 5 MG/5ML PO SOLN: 5.0000 mg | Freq: Once | ORAL | Status: DC | PRN

## 2023-12-06 MED ORDER — SODIUM CHLORIDE (PF) 0.9 % IJ SOLN
INTRAMUSCULAR | Status: DC | PRN
  Administered 2023-12-06: 100 mL

## 2023-12-06 MED ORDER — DEXMEDETOMIDINE HCL IN NACL 80 MCG/20ML IV SOLN
INTRAVENOUS | Status: DC | PRN
Start: 2023-12-06 — End: 2023-12-06
  Administered 2023-12-06 (×2): 8 ug via INTRAVENOUS

## 2023-12-06 MED ORDER — HYDROMORPHONE HCL 1 MG/ML IJ SOLN
0.2500 mg | INTRAMUSCULAR | Status: DC | PRN
  Administered 2023-12-06: 0.5 mg via INTRAVENOUS

## 2023-12-06 MED ORDER — CHLORHEXIDINE GLUCONATE CLOTH 2 % EX PADS: 6.0000 | MEDICATED_PAD | Freq: Once | CUTANEOUS | Status: DC

## 2023-12-06 MED ORDER — SODIUM CHLORIDE (PF) 0.9 % IJ SOLN
INTRAMUSCULAR | Status: AC
  Filled 2023-12-06: qty 100

## 2023-12-06 MED ORDER — BUPIVACAINE LIPOSOME 1.3 % IJ SUSP
INTRAMUSCULAR | Status: AC
Start: 2023-12-06 — End: 2023-12-06
  Filled 2023-12-06: qty 20

## 2023-12-06 MED ORDER — PHENYLEPHRINE 80 MCG/ML (10ML) SYRINGE FOR IV PUSH (FOR BLOOD PRESSURE SUPPORT)
PREFILLED_SYRINGE | INTRAVENOUS | Status: AC
  Filled 2023-12-06: qty 10

## 2023-12-06 MED ORDER — BUPIVACAINE HCL (PF) 0.25 % IJ SOLN
INTRAMUSCULAR | Status: AC
  Filled 2023-12-06: qty 60

## 2023-12-06 MED ORDER — LIDOCAINE 2% (20 MG/ML) 5 ML SYRINGE
INTRAMUSCULAR | Status: DC | PRN
  Administered 2023-12-06: 100 mg via INTRAVENOUS

## 2023-12-06 MED ORDER — ROCURONIUM BROMIDE 10 MG/ML (PF) SYRINGE
PREFILLED_SYRINGE | INTRAVENOUS | Status: DC | PRN
  Administered 2023-12-06: 60 mg via INTRAVENOUS

## 2023-12-06 MED ORDER — ONDANSETRON HCL 4 MG/2ML IJ SOLN
INTRAMUSCULAR | Status: DC | PRN
  Administered 2023-12-06: 4 mg via INTRAVENOUS

## 2023-12-06 MED ORDER — ACETAMINOPHEN 500 MG PO TABS
ORAL_TABLET | ORAL | Status: AC
  Filled 2023-12-06: qty 2

## 2023-12-06 MED ORDER — ONDANSETRON HCL 4 MG/2ML IJ SOLN: 4.0000 mg | Freq: Once | INTRAMUSCULAR | Status: DC | PRN

## 2023-12-06 MED ORDER — AMISULPRIDE (ANTIEMETIC) 5 MG/2ML IV SOLN
INTRAVENOUS | Status: AC
  Filled 2023-12-06: qty 4

## 2023-12-06 MED ORDER — CEFAZOLIN SODIUM-DEXTROSE 2-4 GM/100ML-% IV SOLN
2.0000 g | INTRAVENOUS | Status: AC
  Administered 2023-12-06: 2 g via INTRAVENOUS

## 2023-12-06 MED ORDER — KETAMINE HCL 50 MG/5ML IJ SOSY
PREFILLED_SYRINGE | INTRAMUSCULAR | Status: AC
  Filled 2023-12-06: qty 5

## 2023-12-06 SURGICAL SUPPLY — 59 items
BINDER BREAST 3XL (GAUZE/BANDAGES/DRESSINGS) IMPLANT
BINDER BREAST LRG (GAUZE/BANDAGES/DRESSINGS) IMPLANT
BINDER BREAST MEDIUM (GAUZE/BANDAGES/DRESSINGS) IMPLANT
BINDER BREAST XLRG (GAUZE/BANDAGES/DRESSINGS) IMPLANT
BINDER BREAST XXLRG (GAUZE/BANDAGES/DRESSINGS) IMPLANT
BIOPATCH RED 1 DISK 7.0 (GAUZE/BANDAGES/DRESSINGS) ×2 IMPLANT
BLADE HEX COATED 2.75 (ELECTRODE) IMPLANT
BLADE SURG 10 STRL SS (BLADE) ×6 IMPLANT
BLADE SURG 15 STRL LF DISP TIS (BLADE) ×1 IMPLANT
CANISTER SUCT 1200ML W/VALVE (MISCELLANEOUS) IMPLANT
COTTONBALL LRG STERILE PKG (GAUZE/BANDAGES/DRESSINGS) IMPLANT
DERMABOND ADVANCED .7 DNX12 (GAUZE/BANDAGES/DRESSINGS) ×2 IMPLANT
DRAIN CHANNEL 19F RND (DRAIN) ×2 IMPLANT
DRAPE IMP U-DRAPE 54X76 (DRAPES) IMPLANT
DRAPE UTILITY XL STRL (DRAPES) ×1 IMPLANT
DRSG TEGADERM 4X4.75 (GAUZE/BANDAGES/DRESSINGS) ×2 IMPLANT
ELECTRODE BLDE 4.0 EZ CLN MEGD (MISCELLANEOUS) ×1 IMPLANT
ELECTRODE REM PT RTRN 9FT ADLT (ELECTROSURGICAL) ×2 IMPLANT
EVACUATOR SILICONE 100CC (DRAIN) ×2 IMPLANT
GAUZE PAD ABD 8X10 STRL (GAUZE/BANDAGES/DRESSINGS) ×2 IMPLANT
GAUZE SPONGE 2X2 STRL 8-PLY (GAUZE/BANDAGES/DRESSINGS) ×2 IMPLANT
GAUZE XEROFORM 5X9 LF (GAUZE/BANDAGES/DRESSINGS) IMPLANT
GLOVE BIO SURGEON STRL SZ 6.5 (GLOVE) IMPLANT
GLOVE BIO SURGEON STRL SZ7.5 (GLOVE) IMPLANT
GLOVE BIO SURGEON STRL SZ8 (GLOVE) ×1 IMPLANT
GLOVE BIOGEL PI IND STRL 7.0 (GLOVE) IMPLANT
GLOVE BIOGEL PI IND STRL 8 (GLOVE) ×1 IMPLANT
GOWN STRL REUS W/ TWL LRG LVL3 (GOWN DISPOSABLE) ×1 IMPLANT
GOWN STRL REUS W/TWL XL LVL3 (GOWN DISPOSABLE) ×1 IMPLANT
HEMOSTAT ARISTA ABSORB 3G PWDR (HEMOSTASIS) IMPLANT
HIBICLENS CHG 4% 4OZ BTL (MISCELLANEOUS) ×1 IMPLANT
MANIFOLD NEPTUNE II (INSTRUMENTS) ×1 IMPLANT
MARKER SKIN DUAL TIP RULER LAB (MISCELLANEOUS) ×1 IMPLANT
NDL HYPO 22X1.5 SAFETY MO (MISCELLANEOUS) ×2 IMPLANT
NEEDLE HYPO 22X1.5 SAFETY MO (MISCELLANEOUS) ×2 IMPLANT
NS IRRIG 1000ML POUR BTL (IV SOLUTION) ×1 IMPLANT
PACK BASIN DAY SURGERY FS (CUSTOM PROCEDURE TRAY) ×1 IMPLANT
PACK UNIVERSAL I (CUSTOM PROCEDURE TRAY) ×1 IMPLANT
PENCIL SMOKE EVACUATOR (MISCELLANEOUS) ×2 IMPLANT
PIN SAFETY STERILE (MISCELLANEOUS) ×1 IMPLANT
SLEEVE SCD COMPRESS KNEE MED (STOCKING) ×1 IMPLANT
SPONGE T-LAP 18X18 ~~LOC~~+RFID (SPONGE) ×3 IMPLANT
STAPLER SKIN PROX WIDE 3.9 (STAPLE) ×1 IMPLANT
STRIP CLOSURE SKIN 1/2X4 (GAUZE/BANDAGES/DRESSINGS) IMPLANT
SUT MNCRL AB 3-0 PS2 27 (SUTURE) ×4 IMPLANT
SUT MNCRL AB 4-0 PS2 18 (SUTURE) ×4 IMPLANT
SUT MON AB 2-0 CT1 36 (SUTURE) ×1 IMPLANT
SUT MON AB 5-0 PS2 18 (SUTURE) IMPLANT
SUT SILK 2 0 SH (SUTURE) ×2 IMPLANT
SUT VIC AB 3-0 SH 27X BRD (SUTURE) IMPLANT
SYR 20ML LL LF (SYRINGE) ×2 IMPLANT
SYR BULB IRRIG 60ML STRL (SYRINGE) IMPLANT
SYR CONTROL 10ML LL (SYRINGE) ×1 IMPLANT
TAPE MEASURE VINYL STERILE (MISCELLANEOUS) IMPLANT
TOWEL GREEN STERILE FF (TOWEL DISPOSABLE) ×2 IMPLANT
TRAY DSU PREP LF (CUSTOM PROCEDURE TRAY) ×1 IMPLANT
TUBE CONNECTING 20X1/4 (TUBING) ×1 IMPLANT
UNDERPAD 30X36 HEAVY ABSORB (UNDERPADS AND DIAPERS) ×2 IMPLANT
YANKAUER SUCT BULB TIP NO VENT (SUCTIONS) ×1 IMPLANT

## 2023-12-06 NOTE — Transfer of Care (Signed)
 Immediate Anesthesia Transfer of Care Note  Patient: Kaitlyn Valentine  Procedure(s) Performed: MAMMOPLASTY, REDUCTION (Bilateral: Breast)  Patient Location: PACU  Anesthesia Type:General  Level of Consciousness: awake, alert , and patient cooperative  Airway & Oxygen  Therapy: Patient Spontanous Breathing and Patient connected to nasal cannula oxygen   Post-op Assessment: Report given to RN and Post -op Vital signs reviewed and stable  Post vital signs: Reviewed and stable  Last Vitals:  Vitals Value Taken Time  BP    Temp    Pulse 107 12/06/23 12:00  Resp 2 12/06/23 12:00  SpO2 100 % 12/06/23 12:00  Vitals shown include unfiled device data.  Last Pain:  Vitals:   12/06/23 0633  TempSrc: Temporal  PainSc: 0-No pain      Patients Stated Pain Goal: 3 (12/06/23 9366)  Complications: No notable events documented.

## 2023-12-06 NOTE — Discharge Instructions (Addendum)
 INSTRUCTIONS FOR AFTER BREAST SURGERY   You will likely have some questions about what to expect following your operation.  The following information will help you and your family understand what to expect when you are discharged from the hospital.  It is important to follow these guidelines to help ensure a smooth recovery and reduce complication.  Postoperative instructions include information on: diet, wound care, medications and physical activity.  AFTER SURGERY Expect to go home after the procedure.  In some cases, you may need to spend one night in the hospital for observation.  DIET Breast surgery does not require a specific diet.  However, the healthier you eat the better your body will heal. It is important to increasing your protein intake.  This means limiting the foods with sugar and carbohydrates.  Focus on vegetables and some meat.  If you have liposuction during your procedure be sure to drink water.  If your urine is bright yellow, then it is concentrated, and you need to drink more water.  As a general rule after surgery, you should have 8 ounces of water every hour while awake.  If you find you are persistently nauseated or unable to take in liquids let us  know.  NO TOBACCO USE or EXPOSURE.  This will slow your healing process and lead to a wound.  WOUND CARE Leave the binder on at all times except when showering . Use fragrance free soap like Dial, Dove or [place].   After 24 hours you can remove the binder to shower. Once dry apply binder or sports bra. No baths, pools or hot tubs for four weeks. We close your incision to leave the smallest and best-looking scar. No ointment or creams on your incisions for four weeks.  No Neosporin (Too many skin reactions).  A few weeks after surgery you can use Mederma and start massaging the scar. We ask you to wear your binder or sports bra for the first 6 weeks around the clock, including while sleeping. This provides added comfort and helps  reduce the fluid accumulation at the surgery site. NO Ice or heating pads to the operative site.  You have a very high risk of a BURN before you feel the temperature change. Continue to empty, recharge, & record drainage from drains 2-3 times a day, as needed.   ACTIVITY No heavy lifting until cleared by the doctor.  This usually means no more than a half-gallon of milk.  It is OK to walk and climb stairs. Moving your legs is very important to decrease your risk of a blood clot.  It will also help keep you from getting deconditioned.  Every 1 to 2 hours get up and walk for 5 minutes. This will help with a quicker recovery back to normal.  Let pain be your guide so you don't do too much.  This time is for you to recover.  You will be more comfortable if you sleep and rest with your head elevated either with a few pillows under you or in a recliner.  No stomach sleeping for a three months.  WORK Everyone returns to work at different times. As a rough guide, most people take at least 1 - 2 weeks off prior to returning to work. If you need documentation for your job, give the forms to the front staff at the clinic.  DRIVING Arrange for someone to bring you home from the hospital after your surgery.  You may be able to drive a few days  after surgery but not while taking any narcotics or valium.  BOWEL MOVEMENTS Constipation can occur after anesthesia and while taking pain medication.  It is important to stay ahead for your comfort.  We recommend taking Milk of Magnesia (2 tablespoons; twice a day) while taking the pain pills.  MEDICATIONS You may be prescribed should start after surgery At your preoperative visit for you history and physical you may have been given the following medications: Zofran  4 mg:  This is to treat nausea and vomiting.  You can take this every 6 hours as needed and only if needed. Oxycodone  5 mg:  This is only to be used after you have taken the Motrin  or the Tylenol . Every 8  hours as needed.   Over the counter Medication to take: Ibuprofen  (Motrin ) 600 mg:  Take this every 6 hours.  If you have additional pain then take 500 mg of the Tylenol  every 8 hours.  Only take the Norco after you have tried these two. MiraLAX or Milk of Magnesia: Take this according to the bottle if you take the Norco.  WHEN TO CALL Call your surgeon's office if any of the following occur: Fever 101 degrees F or greater Excessive bleeding or fluid from the incision site. Pain that increases over time without aid from the medications Redness, warmth, or pus draining from incision sites Persistent nausea or inability to take in liquids Severe misshapen area that underwent the operation.  Here are some resources for breast cancer patients:  Plastic surgery website: https://www.plasticsurgery.org/for-medical-professionals/education-and-resources/publications/breast-reconstruction-magazine Breast Reconstruction Awareness Campaign:  ChessContest[employer]fr Plastic surgery Implant information:  https://www.plasticsurgery.org/patient-safety/breast-implant-safety  About my Jackson-Pratt Bulb Drain  What is a Jackson-Pratt bulb? A Jackson-Pratt is a soft, round device used to collect drainage. It is connected to a long, thin drainage catheter, which is held in place by one or two small stiches near your surgical incision site. When the bulb is squeezed, it forms a vacuum, forcing the drainage to empty into the bulb.  Emptying the Jackson-Pratt bulb- To empty the bulb: 1. Release the plug on the top of the bulb. 2. Pour the bulb's contents into a measuring container which your nurse will provide. 3. Record the time emptied and amount of drainage. Empty the drain(s) as often as your     doctor or nurse recommends.  Date                  Time                    Amount (Drain 1)                 Amount (Drain  2)  _____________________________________________________________________  _____________________________________________________________________  _____________________________________________________________________  _____________________________________________________________________  _____________________________________________________________________  _____________________________________________________________________  _____________________________________________________________________  _____________________________________________________________________  Squeezing the Jackson-Pratt Bulb- To squeeze the bulb: 1. Make sure the plug at the top of the bulb is open. 2. Squeeze the bulb tightly in your fist. You will hear air squeezing from the bulb. 3. Replace the plug while the bulb is squeezed. 4. Use a safety pin to attach the bulb to your clothing. This will keep the catheter from     pulling at the bulb insertion site.  When to call your doctor- Call your doctor if: Drain site becomes red, swollen or hot. You have a fever greater than 101 degrees F. There is oozing at the drain site. Drain falls out (apply a guaze bandage over the drain hole and secure it with tape). Drainage  increases daily not related to activity patterns. (You will usually have more drainage when you are active than when you are resting.) Drainage has a bad odor.

## 2023-12-06 NOTE — Anesthesia Postprocedure Evaluation (Signed)
 Anesthesia Post Note  Patient: Kaitlyn Valentine  Procedure(s) Performed: MAMMOPLASTY, REDUCTION (Bilateral: Breast)     Patient location during evaluation: PACU Anesthesia Type: General Level of consciousness: awake Pain management: pain level controlled Vital Signs Assessment: post-procedure vital signs reviewed and stable Respiratory status: spontaneous breathing, nonlabored ventilation and respiratory function stable Cardiovascular status: blood pressure returned to baseline and stable Postop Assessment: no apparent nausea or vomiting Anesthetic complications: no   No notable events documented.  Last Vitals:  Vitals:   12/06/23 1245 12/06/23 1341  BP: (!) 127/92 (!) 123/92  Pulse: 71 76  Resp: 16 16  Temp:  (!) 36.2 C  SpO2: 99% 100%    Last Pain:  Vitals:   12/06/23 1341  TempSrc: Temporal  PainSc:                  Delon Aisha Arch

## 2023-12-06 NOTE — Anesthesia Procedure Notes (Signed)
 Procedure Name: Intubation Date/Time: 12/06/2023 7:43 AM  Performed by: Delayne Olam BIRCH, CRNAPre-anesthesia Checklist: Patient identified, Emergency Drugs available, Suction available and Patient being monitored Patient Re-evaluated:Patient Re-evaluated prior to induction Oxygen  Delivery Method: Circle system utilized Preoxygenation: Pre-oxygenation with 100% oxygen  Induction Type: IV induction Ventilation: Mask ventilation without difficulty Laryngoscope Size: Mac and 3 Grade View: Grade I Tube type: Oral Tube size: 7.0 mm Number of attempts: 1 Airway Equipment and Method: Stylet and Oral airway Placement Confirmation: ETT inserted through vocal cords under direct vision, positive ETCO2 and breath sounds checked- equal and bilateral Secured at: 22 cm Tube secured with: Tape Dental Injury: Teeth and Oropharynx as per pre-operative assessment

## 2023-12-06 NOTE — Interval H&P Note (Signed)
 History and Physical Interval Note: No change in exam or indication for surgery All questions answered Marked for a bilateral breast reduction with her assistance Will proceed at her request  12/06/2023 7:07 AM  Kaitlyn Valentine  has presented today for surgery, with the diagnosis of n62.  The various methods of treatment have been discussed with the patient and family. After consideration of risks, benefits and other options for treatment, the patient has consented to  Procedure(s): MAMMOPLASTY, REDUCTION (Bilateral) as a surgical intervention.  The patient's history has been reviewed, patient examined, no change in status, stable for surgery.  I have reviewed the patient's chart and labs.  Questions were answered to the patient's satisfaction.     [name]

## 2023-12-06 NOTE — Op Note (Signed)
 DATE OF OPERATION: 12/06/2023   LOCATION: [facility] operating Room   PREOPERATIVE DIAGNOSIS: Symptomatic macromastia   POSTOPERATIVE DIAGNOSIS: Same   PROCEDURE: Bilateral breast reduction   SURGEON: Marinell Birmingham, MD   ASSISTANT: Estefana Peck   EBL: 150 cc   CONDITION: Stable   COMPLICATIONS: None   INDICATION: The patient, Kaitlyn Valentine, is a 43 y.o. female born on 1981/02/15, is here for treatment upper back and neck pain secondary to large breast size.     PROCEDURE DETAILS:  The patient was seen prior to surgery and marked.  IV antibiotics were given. The patient was taken to the operating room,SCDs were placed, and given a general anesthetic. A standard time out was performed and all information was confirmed by those in the room.    The chest was prepped and draped in usual sterile manner.  A 42 mm cookie cutter was used to outline the proposed nipple areolar complexes bilaterally and an 8 cm based inferior pedicle was outlined on each breast.  The right breast was addressed first.  Laparotomy tape was placed at the base of breast as a tourniquet and the pedicle was de-epithelialized sharply.  Electrocautery was used to dissect the borders of the pedicle to the chest wall.  Electrocautery was then used to resect the medial lateral and superior triangles of breast tissue and to develop within the superior skin flap.  The breast tissue removed constituted the bulk of the reduction and weighed 800 gms.  The subfascial space over the pectoralis muscle and the subcutaneous tissues were infiltrated with 50 mL of a mixture of Exparel , quarter percent plain Marcaine , and saline.  The surgical wound was irrigated and hemostasis ensured with electrocautery.  A 19 French round drain was placed behind the pedicle and brought out through a separate stab incision.  The T point was approximated with a single 2-0 Monocryl suture and the skin edges were tailor tacked in place  with skin clips.  Dermis was approximated with interrupted and running 3-0 Monocryl sutures and the skin was closed with a running 4-0 Monocryl subcuticular stitch.  Attention was turned to the left breast where similar procedure was performed.  After placing a laparotomy tape at the base of the breast as a tourniquet the pedicle was de-epithelialized sharply and dissected to the chest wall with electrocautery.  Medial lateral and superior triangles of breast tissue were resected and the superior skin flap was developed and thinned.  The breast tissue removed constituted the bulk of the reduction and weighed 562 gms.  All breast tissue was sent to pathology for routine examination.  Again the subfascial space was infiltrated with the Exparel  mixture and the surgical bed irrigated.  After ensuring hemostasis a 19 French round drain was placed behind the pedicle and brought out through a separate stab incision. The T point was approximated with a 2-0 Monocryl suture and the skin edges were tailor tacked in place with skin clips.  Dermis was closed with interrupted and running 3-0 Monocryl sutures and skin was closed with running 4-0 Monocryl subcuticular stitch.  All incisions were then sealed with Dermabond the patient was placed in a supportive, compressive garment.  She was awakened from anesthesia without incident and transferred to the recovery room in good condition.  All instrument needle and sponge counts were reported as correct and there were no complications appreciated during the procedure. The patient was allowed to wake up and taken to recovery room in stable  condition at the end of the case. The family was notified at the end of the case.    The advanced practice practitioner (APP) assisted throughout the case.  The APP was essential in retraction and counter traction when needed to make the case progress smoothly.  This retraction and assistance made it possible to see the tissue plans for the  procedure.  The assistance was needed for blood control, tissue re-approximation and assisted with closure of the incision site.

## 2023-12-07 ENCOUNTER — Ambulatory Visit: Admitting: Surgical

## 2023-12-07 ENCOUNTER — Encounter (HOSPITAL_BASED_OUTPATIENT_CLINIC_OR_DEPARTMENT_OTHER): Payer: Self-pay | Admitting: Plastic Surgery

## 2023-12-07 DIAGNOSIS — N6489 Other specified disorders of breast: Secondary | ICD-10-CM

## 2023-12-07 DIAGNOSIS — M542 Cervicalgia: Secondary | ICD-10-CM

## 2023-12-07 DIAGNOSIS — N62 Hypertrophy of breast: Secondary | ICD-10-CM

## 2023-12-07 DIAGNOSIS — M25519 Pain in unspecified shoulder: Secondary | ICD-10-CM

## 2023-12-07 NOTE — Progress Notes (Signed)
 Patient is a 43 y.o.-year-old female status post bilateral breast reduction with Dr.  Waddell. Patient is 1 day postop.  She reports she is overall doing well, she has been drinking plenty of fluids, urinating normally.  Denies any infectious symptoms.  No shortness of breath or chest pain.  She does report that she has not had any flatus or bowel movements yet.  She has been feeling well without any issues.  Pain has been well-controlled, has not needed any medication.  JP drain output has been approximately 50 to 60 cc per side.  Drainage is serosanguineous but slightly more sanguinous.  Chaperone present on exam Bilateral NAC's are viable, bilateral breast incisions are intact. Bilateral NAC's with sensation. There is no erythema or cellulitic changes noted. No obvious subcutaneous fluid collections noted with palpation.  JP drains in place.   A/P:  Recommend continuing with compressive garment 24/7 until 6 weeks post-op,  avoiding strenuous activity/heavy lifting until 6 weeks post-op  Recommend following up  tomorrow for consideration of drain removal.  She did get nauseous during today's appointment after removal of binder, this quickly resolved.  She did not have any dizziness or lightheadedness.  All of the patient's questions were answered to their content. Recommend calling with any questions or concerns.

## 2023-12-08 ENCOUNTER — Ambulatory Visit (INDEPENDENT_AMBULATORY_CARE_PROVIDER_SITE_OTHER): Admitting: Student

## 2023-12-08 VITALS — BP 106/66 | HR 77

## 2023-12-08 DIAGNOSIS — N62 Hypertrophy of breast: Secondary | ICD-10-CM

## 2023-12-08 LAB — SURGICAL PATHOLOGY

## 2023-12-08 NOTE — Progress Notes (Signed)
 Patient is a 43 year old female who underwent bilateral breast reduction with Dr. Waddell on 12/06/2023.  Patient is 2 days postop.  She presents to the clinic today for postoperative follow-up.  Patient was last seen in the clinic yesterday.  At this visit, patient was doing well.  Her output had been approximately 50 to 60 cc per side.  The drainage was serosanguineous, but slightly more sanguinous.  The bilateral NAC's on exam were viable and incisions were intact.  There were no obvious fluid collections on exam.  It was recommended that patient continue with compressive garments and follow-up in about a day for possible drain removal.  Today, patient reports she is doing well.  She states that she has had a little bit of pain, but it is improved from yesterday.  She states that she has been taking medications which have been helping.  She denies any fevers or chills.  She denies any nausea or vomiting.  She reports she is eating and drinking without issue.  She reports that her left drain has put out about 25 cc in her right drain put out about 50 cc since yesterday.  Chaperone present on exam.  On exam, patient is sitting upright in no acute distress.  Breasts are soft and symmetric.  There is no overlying erythema.  No obvious fluid collections palpated on exam.  NAC's appear to be healthy.  Sensation is intact bilaterally.  Incisions are clean dry and intact.  JP drains are in place and functioning bilaterally with minimal clear serous sanguinous drainage in each of the bulbs.  Drains were removed without any difficulty.  Patient tolerated well.  Patient did become a little bit nauseous  during today's visit, but after a few moments and drinking some water, her nausea improved.  She denied any dizziness or lightheadedness.  Discussed with the patient the importance of wearing her compression at all times.  I discussed with her that her drain sites may drain a little bit over the next few days.   Recommended that she apply gauze and tape over them daily or as needed for saturation.  Patient expressed understanding.  Discussed with the patient to avoid strenuous activities.  Patient to follow back up next week.  I instructed her to call in the meantime if she has any questions or concerns about anything.

## 2023-12-13 ENCOUNTER — Encounter: Admitting: Surgical

## 2023-12-14 ENCOUNTER — Ambulatory Visit: Admitting: Surgical

## 2023-12-14 ENCOUNTER — Encounter: Payer: Self-pay | Admitting: Surgical

## 2023-12-14 VITALS — BP 112/74 | HR 90 | Ht 67.0 in | Wt 186.0 lb

## 2023-12-14 DIAGNOSIS — N62 Hypertrophy of breast: Secondary | ICD-10-CM

## 2023-12-14 NOTE — Progress Notes (Signed)
 Patient is a 43 y.o.-year-old female status post bilateral breast reduction with Dr.  Waddell. Patient is 1 week postop.  She reports she is overall doing well.  She does feel like she has some lateral breast fullness which bothers her.  She is not having any pain or concerns.  She does report that she has some fatigue/gets tired more frequently than before.  Chaperone present on exam Bilateral NAC's are viable, bilateral breast incisions are intact. There is no erythema or cellulitic changes noted. No obvious subcutaneous fluid collections noted with palpation.  Bilateral breasts are symmetric.   A/P:  She is doing really well, there is no signs of concern on exam.  No signs of infection.   Recommend continuing with compressive garment 24/7 until 6 weeks post-op,  avoiding strenuous activity/heavy lifting until 6 weeks post-op  Recommend following up in 2 weeks  All of the patient's questions were answered to their content. Recommend calling with any questions or concerns.

## 2023-12-26 ENCOUNTER — Encounter: Admitting: Plastic Surgery

## 2023-12-28 ENCOUNTER — Ambulatory Visit (INDEPENDENT_AMBULATORY_CARE_PROVIDER_SITE_OTHER): Admitting: Surgical

## 2023-12-28 ENCOUNTER — Encounter: Payer: Self-pay | Admitting: Surgical

## 2023-12-28 VITALS — BP 109/74 | HR 90

## 2023-12-28 DIAGNOSIS — M25519 Pain in unspecified shoulder: Secondary | ICD-10-CM

## 2023-12-28 DIAGNOSIS — M25512 Pain in left shoulder: Secondary | ICD-10-CM

## 2023-12-28 DIAGNOSIS — N62 Hypertrophy of breast: Secondary | ICD-10-CM

## 2023-12-28 DIAGNOSIS — M542 Cervicalgia: Secondary | ICD-10-CM

## 2023-12-28 DIAGNOSIS — N6489 Other specified disorders of breast: Secondary | ICD-10-CM

## 2023-12-28 MED ORDER — SULFAMETHOXAZOLE-TRIMETHOPRIM 800-160 MG PO TABS: 1.0000 | ORAL_TABLET | Freq: Two times a day (BID) | ORAL | 0 refills | Status: AC

## 2023-12-28 MED ORDER — SULFAMETHOXAZOLE-TRIMETHOPRIM 800-160 MG PO TABS: 1.0000 | ORAL_TABLET | Freq: Two times a day (BID) | ORAL | 0 refills | Status: DC

## 2023-12-28 NOTE — Progress Notes (Signed)
 Patient is a 43 y.o.-year-old female status post bilateral breast reduction with Dr.  Waddell. Patient is 3 weeks postop.  She reports that she has noticed drainage from her right breast over the last week or so, reports the drainage has been yellowish in color.  She does have some tenderness of the right breast as well, specifically in the lateral aspect and medial aspects.  She otherwise feels well, reports she did see her PCP today for migraine and had a injection in her left shoulder.  Per EMR review, patient had Toradol  and Medrol injection.  She is not having any infectious symptoms, denies any fevers or chills.  Chaperone present on exam Bilateral NAC's are viable, bilateral breast incisions are overall intact.  She does have a small pinhole opening along the medial right breast and purulent fluid is expressed with palpation.  She also has a wound of the left T-junction. There is no erythema or cellulitic changes noted of either breast. No obvious subcutaneous fluid collections noted with palpation.  Patient is well-developed, well-nourished, no acute distress.  Breathing is unlabored.  She does not appear ill.  A/P:  Patient is overall doing well after breast reduction, however did have some purulent fluid expressed from a pinhole opening of the right medial breast.  Given tenderness and purulent drainage, will send prescription for Bactrim  x 7 days.  Recommend close follow-up early next week for reevaluation.  Discussed with patient to call if she has any symptomatic changes or any other concerning signs.  Discussed with patient is reassuring that the area is draining and I suspect it will continue to improve.  Recommend keeping the pinhole opening covered with gauze, recommend Vaseline and gauze to left breast T-junction wound.  All of the patient's questions were answered to their content. Recommend calling with any questions or concerns.

## 2024-01-04 ENCOUNTER — Encounter: Admitting: Surgical

## 2024-01-06 ENCOUNTER — Ambulatory Visit: Admitting: Student

## 2024-01-06 VITALS — BP 109/74 | HR 83

## 2024-01-06 DIAGNOSIS — N62 Hypertrophy of breast: Secondary | ICD-10-CM

## 2024-01-06 NOTE — Progress Notes (Signed)
 Patient is a 43 year old female who underwent bilateral breast reduction with Dr. Waddell on 12/06/2023.  She is a little over 4 weeks postop.  She presents to the clinic today for postoperative follow-up.  Patient was last seen in the clinic on 12/28/2023.  At this visit, patient reported she had some drainage from her right breast.  On exam, bilateral NAC's were viable and incisions were overall intact.  There was a small pinhole opening along the medial right breast and purulent fluid was expressed with palpation.  Patient was placed on Bactrim  given tenderness and purulent drainage.  Today, patient reports she is doing well.  She states that she sometimes has some shooting pains to her breasts bilaterally.  She reports that the drainage to her left breast has completely stopped.  She denies any significant pain to the area.  She denies any fevers or chills.  She does not report any other issues or concerns at this time.  Chaperone present on exam.  On exam, patient is sitting upright in no acute distress.  Breasts are soft and symmetric.  There is no overlying erythema to either breast.  There are no obvious fluid collections on exam.  There is a little bit of firmness noted to the medial breasts/inframammary incisions bilaterally, consistent with scar tissue.  NAC's appear to be healthy bilaterally.  Incisions were all intact and healing well.  No overt signs of infection on exam at this time.  I recommended that patient finish the course of her antibiotics and continue to monitor her breasts.  I discussed with her that if she develops any redness, fevers, chills, increased pain she should let us  know.  Patient expressed understanding.  I recommended that the patient gently massage the areas of firmness.  She expressed understanding.  I discussed with the patient that the shooting pain she is experiencing is most likely nerve pain.  I discussed with her that this should resolve over the following  months.  She expressed understanding.  I discussed with the patient that she may start applying Vaseline to her incisions daily and gently massaging her incisions.  Patient to follow back up in 1 week  I instructed her to call in the meantime if she has any questions or concerns about anything.

## 2024-01-11 ENCOUNTER — Encounter: Admitting: Surgical

## 2024-01-11 ENCOUNTER — Ambulatory Visit (INDEPENDENT_AMBULATORY_CARE_PROVIDER_SITE_OTHER): Admitting: Surgical

## 2024-01-11 DIAGNOSIS — N62 Hypertrophy of breast: Secondary | ICD-10-CM

## 2024-01-11 DIAGNOSIS — M542 Cervicalgia: Secondary | ICD-10-CM

## 2024-01-11 DIAGNOSIS — N6489 Other specified disorders of breast: Secondary | ICD-10-CM

## 2024-01-11 DIAGNOSIS — M25519 Pain in unspecified shoulder: Secondary | ICD-10-CM

## 2024-01-11 NOTE — Progress Notes (Signed)
 Patient is a 43 y.o.-year-old female status post bilateral breast reduction with Dr.  Waddell. Patient is 5 weeks postop.  She reports she is overall doing well, she is not having any infectious symptoms.  She continues to have tenderness of right lateral breast.  She has noticed decreased swelling  Chaperone present on exam Bilateral NAC's are viable, bilateral breast incisions are intact. There is no significant erythema and no cellulitic changes noted. No obvious subcutaneous fluid collections noted with palpation. She does have some swelling of bilateral breast  She does have some tenderness of the right lateral breast.  No overlying skin changes.  No fluctuance noted with palpation.  No fluid collection noted in this area.   A/P:  No signs of obvious infection on exam.  Continue to monitor bilateral breast swelling  Pictures were obtained of the patient and placed in the chart with the patient's or guardian's permission.  Recommend continuing with compressive garment 24/7 until 6 weeks post-op,  avoiding strenuous activity/heavy lifting until 6 weeks post-op  Recommend following up in 2 weeks.  Discussed gentle massage to right lateral breast.  All of the patient's questions were answered to their content. Recommend calling with any questions or concerns.

## 2024-01-18 ENCOUNTER — Encounter: Admitting: Surgical

## 2024-01-25 ENCOUNTER — Ambulatory Visit: Admitting: Physician Assistant

## 2024-01-25 ENCOUNTER — Encounter: Payer: Self-pay | Admitting: Physician Assistant

## 2024-01-25 VITALS — BP 134/86 | HR 88 | Ht 67.0 in | Wt 190.8 lb

## 2024-01-25 DIAGNOSIS — Z9889 Other specified postprocedural states: Secondary | ICD-10-CM

## 2024-01-25 NOTE — Progress Notes (Signed)
 Patient is a pleasant 43 year old female s/p bilateral breast reduction performed 12/06/2023 Dr. Waddell who presents to clinic for postoperative follow-up.  She was last seen here in clinic on 01/11/2024.  At that time, she continued to have tenderness of right lateral breast.  However, no palpable underlying fluid collection or concern for infection.  Exam is benign.  Follow-up in 2 weeks, sooner if needed.  Today, patient is doing well from postoperative standpoint.  She continues to have some soreness on the most lateral aspect of her right breast inframammary incision.  She denies any exertional or muscular component.  Comes and goes.  She is otherwise entirely pleased with the outcome of her breast reduction surgery.  On exam, breasts have excellent shape and symmetry.  NAC's are healthy.  No palpable underlying fluid collections noted.  No redness, cording, firmness, or discrete mass in the area of discomfort on the most lateral aspect of right breast toward the inframammary incision.  May simply be irritation from a buried knot which will likely absorb.  No obvious Mondor's disease / superficial venous thrombosis.  Patient's ongoing discomfort is unclear.  Low suspicion for seroma or other urgent pathology.  Recommend continued massage of the area and suspected to improve with time.  If it is not improved in the next couple of months or if it at all worsens in the interim, recommending that she return for reevaluation.  Otherwise, she may transition to silicone scar gel twice daily x 3 months and with no specific follow-up needed.  Increase activity as tolerated.

## 2024-02-02 IMAGING — MG MM DIGITAL SCREENING BILAT W/ TOMO AND CAD
6 of 10 series · 6 of 30 positions shown · non-contrast
Comparison: Previous exam(s).

CLINICAL DATA: Screening.

EXAM:
DIGITAL SCREENING BILATERAL MAMMOGRAM WITH TOMOSYNTHESIS AND CAD
TECHNIQUE: Bilateral screening digital craniocaudal and mediolateral oblique
mammograms were obtained. Bilateral screening digital breast
tomosynthesis was performed. The images were evaluated with
computer-aided detection.

[L MLO synth-2D]
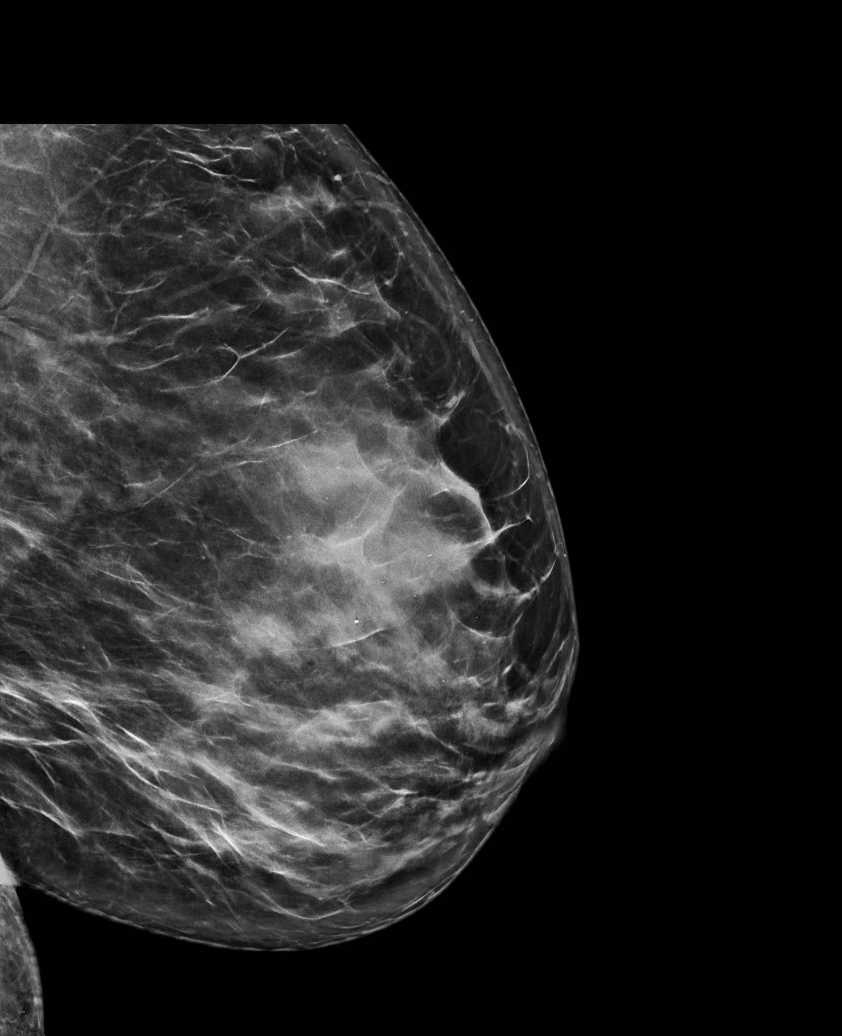

[R MLO synth-2D]
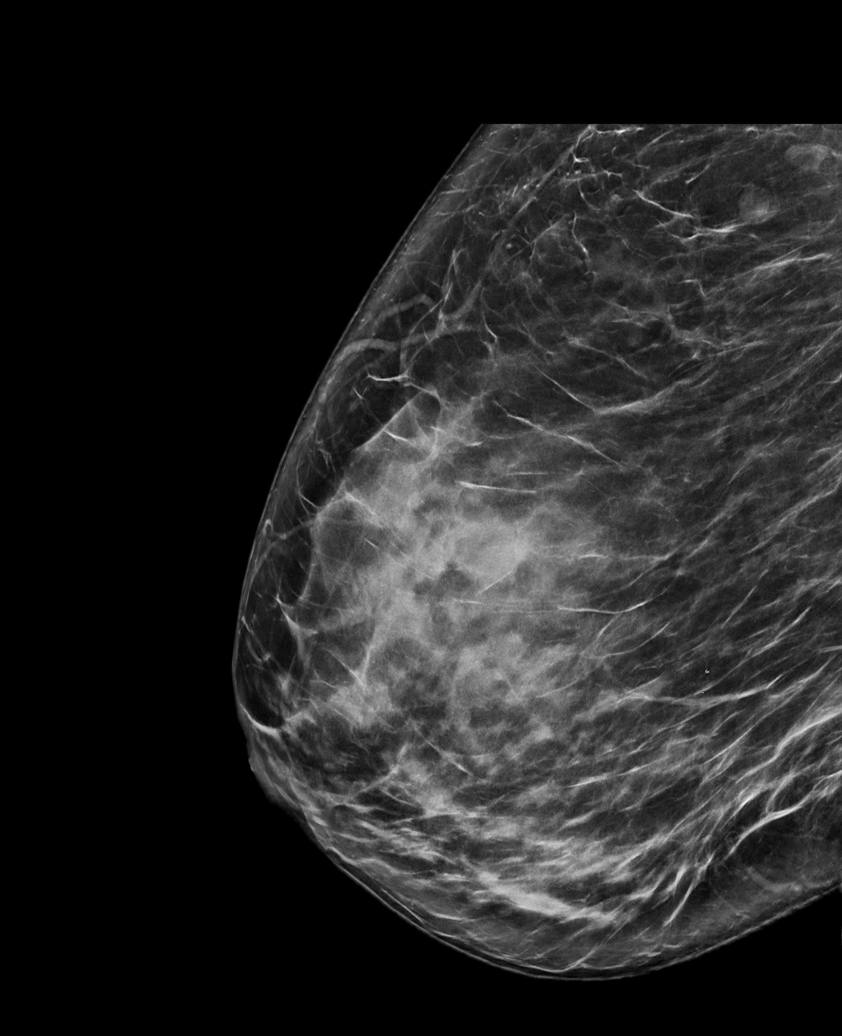

[R CC synth-2D (1 of 2)]
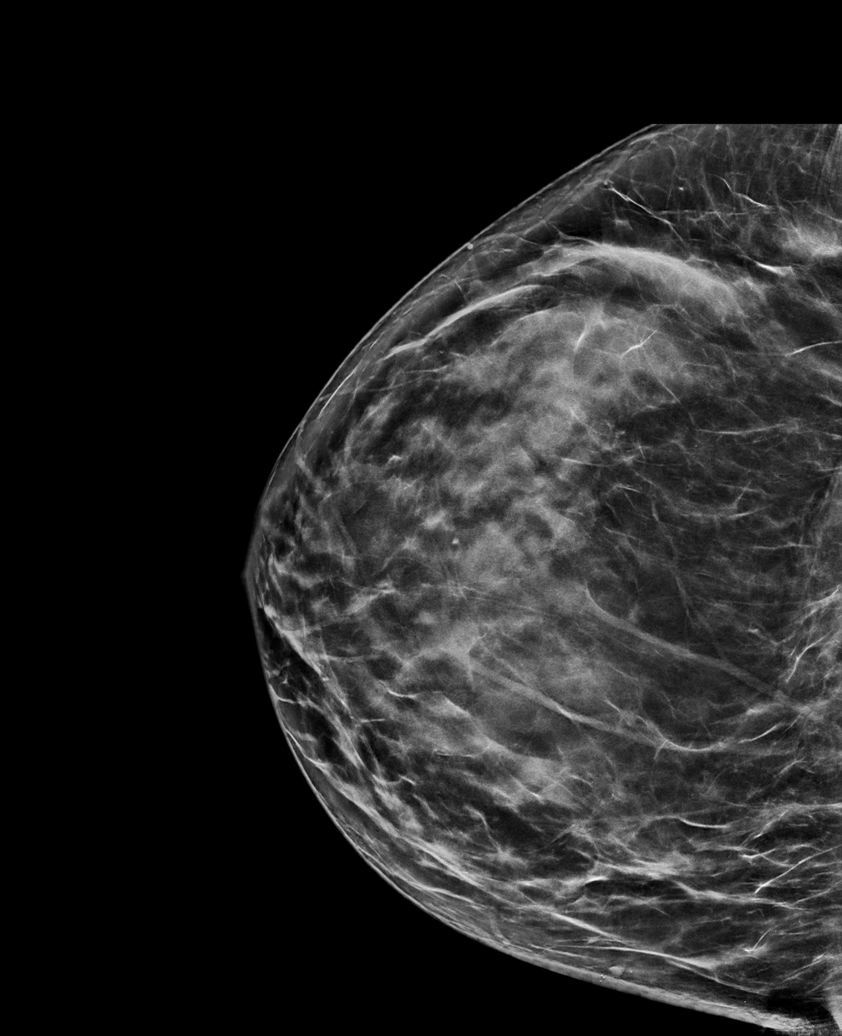

[L CC synth-2D]
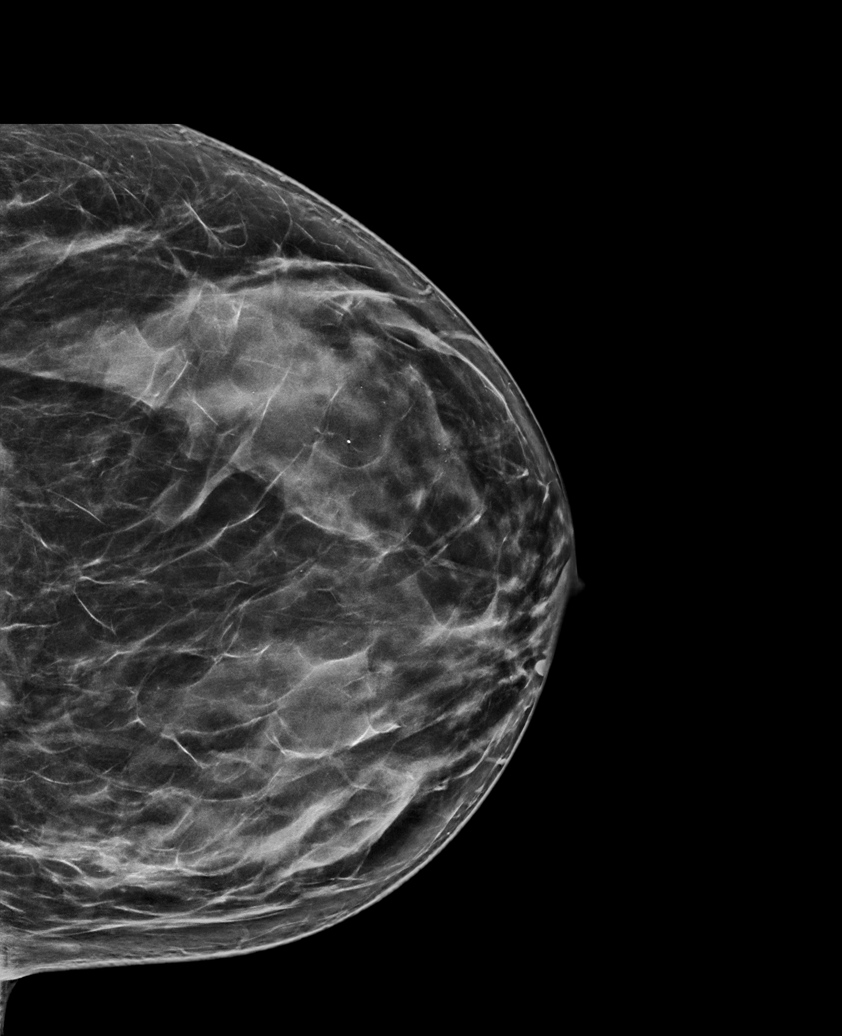

[R CC synth-2D (2 of 2)]
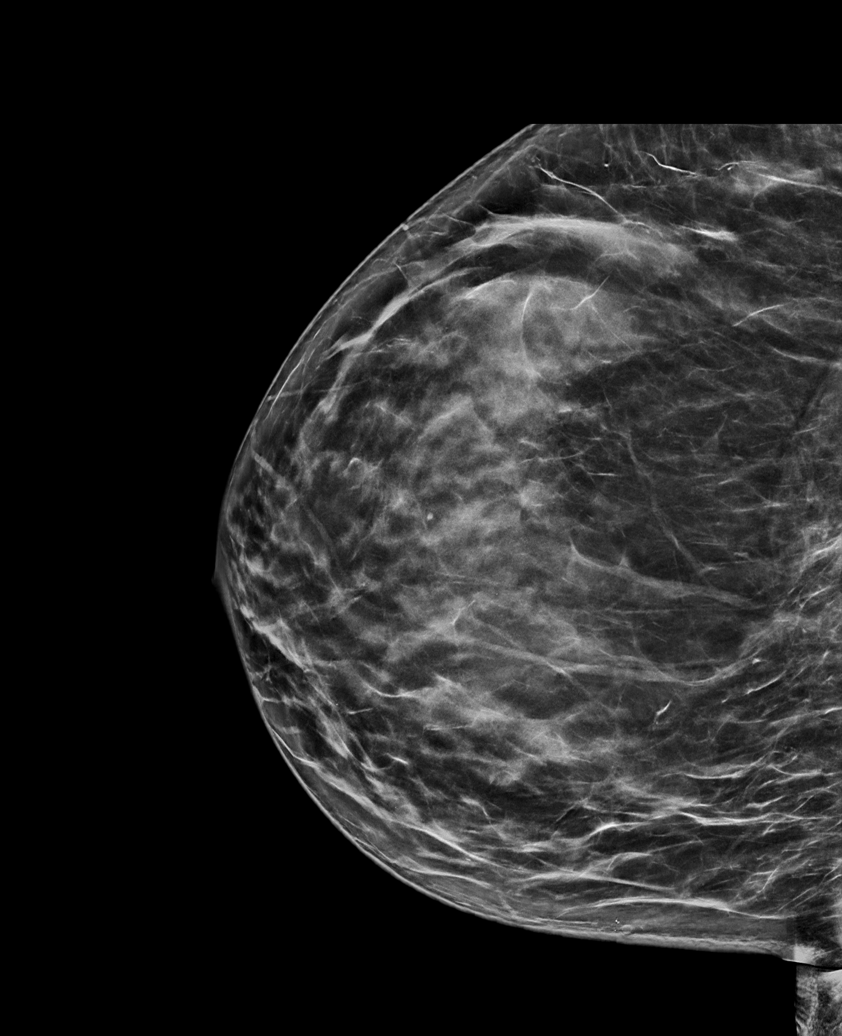

[R CC tomo · tomo slice 41/82.0]
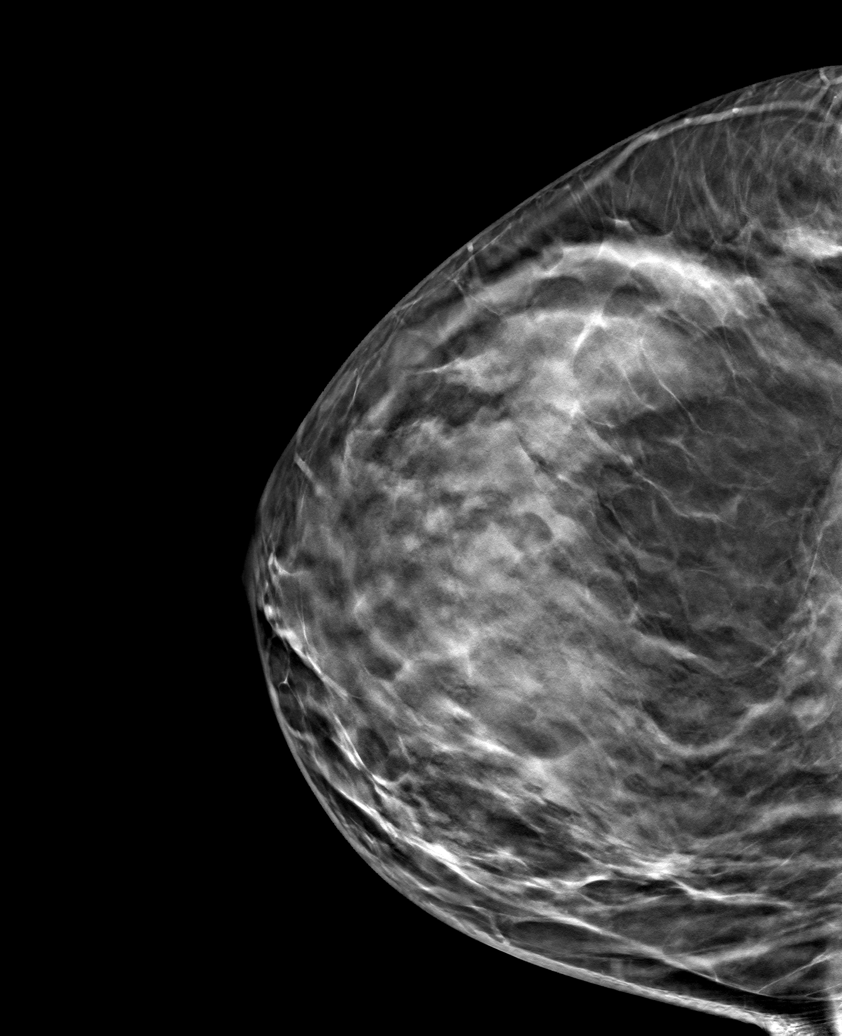

[6 of 30 positions shown; findings below may reference images not displayed]

ACR Breast Density Category c: The breast tissue is heterogeneously
dense, which may obscure small masses.
FINDINGS: There are no findings suspicious for malignancy.
IMPRESSION: No mammographic evidence of malignancy. A result letter of this
screening mammogram will be mailed directly to the patient.

RECOMMENDATION:
Screening mammogram in one year. (Code:Q3-W-BC3)

BI-RADS CATEGORY  1: Negative.
